# Patient Record
Sex: Female | Born: 1985 | Race: Black or African American | Hispanic: No | Marital: Married | Smoking: Never smoker
Health system: Southern US, Community
[De-identification: ages and names within clinical notes are randomized; demographics above are authoritative.]

## PROBLEM LIST (undated history)

## (undated) DIAGNOSIS — E669 Obesity, unspecified: Secondary | ICD-10-CM

## (undated) DIAGNOSIS — J189 Pneumonia, unspecified organism: Secondary | ICD-10-CM

## (undated) DIAGNOSIS — O99519 Diseases of the respiratory system complicating pregnancy, unspecified trimester: Secondary | ICD-10-CM

---

## 2011-08-30 DIAGNOSIS — Z8701 Personal history of pneumonia (recurrent): Secondary | ICD-10-CM | POA: Insufficient documentation

## 2016-08-28 ENCOUNTER — Encounter: Payer: Self-pay | Admitting: Emergency Medicine

## 2016-08-28 ENCOUNTER — Emergency Department
Admission: EM | Admit: 2016-08-28 | Discharge: 2016-08-28 | Disposition: A | Discharge status: Home / Self Care | Payer: Medicaid Other | Attending: Emergency Medicine | Admitting: Emergency Medicine

## 2016-08-28 ENCOUNTER — Emergency Department: Payer: Medicaid Other

## 2016-08-28 DIAGNOSIS — R1030 Lower abdominal pain, unspecified: Secondary | ICD-10-CM | POA: Diagnosis present

## 2016-08-28 DIAGNOSIS — R58 Hemorrhage, not elsewhere classified: Secondary | ICD-10-CM

## 2016-08-28 DIAGNOSIS — O200 Threatened abortion: Secondary | ICD-10-CM | POA: Diagnosis not present

## 2016-08-28 DIAGNOSIS — O208 Other hemorrhage in early pregnancy: Secondary | ICD-10-CM | POA: Insufficient documentation

## 2016-08-28 DIAGNOSIS — Z3A10 10 weeks gestation of pregnancy: Secondary | ICD-10-CM | POA: Insufficient documentation

## 2016-08-28 LAB — CBC
HEMATOCRIT: 35.8 % (ref 35.0–47.0)
HEMOGLOBIN: 12.5 g/dL (ref 12.0–16.0)
MCH: 32.7 pg (ref 26.0–34.0)
MCHC: 35.1 g/dL (ref 32.0–36.0)
MCV: 93.2 fL (ref 80.0–100.0)
PLATELETS: 193 10*3/uL (ref 150–440)
RBC: 3.84 MIL/uL (ref 3.80–5.20)
RDW: 13.4 % (ref 11.5–14.5)
WBC: 6.2 10*3/uL (ref 3.6–11.0)

## 2016-08-28 LAB — POCT PREGNANCY, URINE: Preg Test, Ur: POSITIVE — AB

## 2016-08-28 LAB — HCG, QUANTITATIVE, PREGNANCY: hCG, Beta Chain, Quant, S: 28722 m[IU]/mL — ABNORMAL HIGH (ref <5)

## 2016-08-28 LAB — ABO/RH: ABO/RH(D): A POS

## 2016-08-28 NOTE — ED Provider Notes (Signed)
[facility] Emergency Department Provider Note   ____________________________________________   First MD Initiated Contact with Patient 08/28/16 [phone]     (approximate)  I have reviewed the triage vital signs and the nursing notes.   HISTORY  Chief Complaint Vaginal Bleeding and Abdominal Cramping    HPI Christina Bautista is a 31 y.o. female reports for about the last 3-4 days that she had been having occasional lower abdominal cramping with some slight vaginal spotting. Denies any vaginal bleeding. Reports she may be about [redacted] weeks pregnant, she had a 1 period in May that lasted about 6-7 days around May 24. During mid May she had to take a plan B after intercourse.  Denies any nausea or vomiting. No fevers. No headache. Denies any severe pain or heavy vaginal bleeding. Reports spotting slightly bloody only a pad every few hours.No soaked pads or heavy bleeding.  No pain or burning with urination. No unusual odor or discharge   History reviewed. No pertinent past medical history.  There are no active problems to display for this patient.   History reviewed. No pertinent surgical history.  Prior to Admission medications   Not on File  3 previous pregnancies, one miscarriage. No fertility treatments. No history of tubal or ectopic  Allergies Patient has no known allergies.  No family history on file.  Social History Social History  Substance Use Topics  . Smoking status: Never Smoker  . Smokeless tobacco: Never Used  . Alcohol use No    Review of Systems Constitutional: No fever/chills Eyes: No visual changes. ENT: No sore throat. Cardiovascular: Denies chest pain. Respiratory: Denies shortness of breath. Gastrointestinal: No abdominal pain.  No nausea, no vomiting.  No diarrhea.  No constipation. Genitourinary: Negative for dysuria.See history of present illness Musculoskeletal: Negative for back pain. Skin: Negative for  rash. Neurological: Negative for headaches, focal weakness or numbness.    ____________________________________________   PHYSICAL EXAM:  VITAL SIGNS: ED Triage Vitals  Enc Vitals Group     BP 08/28/16 0942 (!) 141/84     Pulse Rate 08/28/16 0942 78     Resp 08/28/16 0942 16     Temp 08/28/16 0942 99.4 F (37.4 C)     Temp Source 08/28/16 0942 Oral     SpO2 08/28/16 0942 99 %     Weight 08/28/16 0942 230 lb (104.3 kg)     Height 08/28/16 0942 5\' 9"  (1.753 m)     Head Circumference --      Peak Flow --      Pain Score 08/28/16 0941 6     Pain Loc --      Pain Edu? --      Excl. in GC? --     Constitutional: Alert and oriented. Well appearing and in no acute distress. Eyes: Conjunctivae are normal. Head: Atraumatic. Nose: No congestion/rhinnorhea. Mouth/Throat: Mucous membranes are moist. Neck: No stridor.   Cardiovascular: Normal rate, regular rhythm. Grossly normal heart sounds.  Good peripheral circulation. Respiratory: Normal respiratory effort.  No retractions. Lungs CTAB. Gastrointestinal: Soft and nontender except for minimal discomfort suprapubically. No distention. No obviously gravid abdomen Musculoskeletal: No lower extremity tenderness nor edema. Neurologic:  Normal speech and language. No gross focal neurologic deficits are appreciated.  Skin:  Skin is warm, dry and intact. No rash noted. Psychiatric: Mood and affect are normal. Speech and behavior are normal.  ____________________________________________   LABS (all labs ordered are listed, but only abnormal results are displayed)  Labs Reviewed  HCG, QUANTITATIVE, PREGNANCY - Abnormal; Notable for the following:       Result Value   hCG, Beta Chain, [place] Longest 28,722 (*)    All other components within normal limits  POCT PREGNANCY, URINE - Abnormal; Notable for the following:    Preg Test, Ur POSITIVE (*)    All other components within normal limits  CBC  ABO/RH    ____________________________________________  EKG   ____________________________________________  RADIOLOGY  US Ob Comp < 14 Wks  Result Date: 08/28/2016 CLINICAL DATA:  Positive pregnancy test, vaginal bleeding, pelvic cramping EXAM: OBSTETRIC <14 WK [place] AND TRANSVAGINAL OB US TECHNIQUE: Both transabdominal and transvaginal ultrasound examinations were performed for complete evaluation of the gestation as well as the maternal uterus, adnexal regions, and pelvic cul-de-sac. Transvaginal technique was performed to assess early pregnancy. COMPARISON:  None. FINDINGS: Intrauterine gestational sac: Single Yolk sac:  Not Visualized. Embryo:  Visualized. Cardiac Activity: Not Visualized. Heart Rate: Not detected CRL:  2.7 cm   9 w   3 d Subchorionic hemorrhage:  None visualized. Maternal uterus/adnexae: Irregularly shaped gestational sac. Fetal pole visualized but no detectable cardiac activity, motion, or heart rate compatible with nonviable pregnancy. Ovaries appear normal.  No pelvic free fluid. IMPRESSION: Single IUP with a crown-rump length of 2.7 cm but no visualized cardiac activity or detected heart rate. Findings meet definitive criteria for failed pregnancy. This follows SRU consensus guidelines: Diagnostic Criteria for Nonviable Pregnancy Early in the First Trimester. Macy Mis J Med [phone]. Electronically Signed   By: Judie Petit.  Shick M.D.   On: 08/28/2016 11:44   US Ob Transvaginal  Result Date: 08/28/2016 CLINICAL DATA:  Positive pregnancy test, vaginal bleeding, pelvic cramping EXAM: OBSTETRIC <14 WK [place] AND TRANSVAGINAL OB US TECHNIQUE: Both transabdominal and transvaginal ultrasound examinations were performed for complete evaluation of the gestation as well as the maternal uterus, adnexal regions, and pelvic cul-de-sac. Transvaginal technique was performed to assess early pregnancy. COMPARISON:  None. FINDINGS: Intrauterine gestational sac: Single Yolk sac:  Not Visualized. Embryo:   Visualized. Cardiac Activity: Not Visualized. Heart Rate: Not detected CRL:  2.7 cm   9 w   3 d Subchorionic hemorrhage:  None visualized. Maternal uterus/adnexae: Irregularly shaped gestational sac. Fetal pole visualized but no detectable cardiac activity, motion, or heart rate compatible with nonviable pregnancy. Ovaries appear normal.  No pelvic free fluid. IMPRESSION: Single IUP with a crown-rump length of 2.7 cm but no visualized cardiac activity or detected heart rate. Findings meet definitive criteria for failed pregnancy. This follows SRU consensus guidelines: Diagnostic Criteria for Nonviable Pregnancy Early in the First Trimester. Macy Mis J Med [phone]. Electronically Signed   By: Judie Petit.  Shick M.D.   On: 08/28/2016 11:44    ____________________________________________   PROCEDURES  Procedure(s) performed: None  Procedures  Critical Care performed: No  ____________________________________________   INITIAL IMPRESSION / ASSESSMENT AND PLAN / ED COURSE  Pertinent labs & imaging results that were available during my care of the patient were reviewed by me and considered in my medical decision making (see chart for details).    Suspected pregnancy. Now presenting with cramping and light vaginal spotting. Hemodynamically stable. No infectious symptoms. Low risk pretest for ectopic. We'll obtain ultrasound to further evaluate. No acute abdomen. Reassuring examination. RH +  ----------------------------------------- 12:05 PM on 08/28/2016 -----------------------------------------  Case and ultrasound discussed with Dr. Valentino Saxon of cardiology. Patient will follow-up closely outpatient. Reports no pain or discomfort at present, no significant bleeding. Awake and  alert, agreeable with plan for discharge and careful return precautions. Return precautions and treatment recommendations and follow-up discussed with the patient who is agreeable with the plan.        ____________________________________________   FINAL CLINICAL IMPRESSION(S) / ED DIAGNOSES  Final diagnoses:  Bleeding  Threatened miscarriage in early pregnancy      NEW MEDICATIONS STARTED DURING THIS VISIT:  New Prescriptions   No medications on file     Note:  This document was prepared using Dragon voice recognition software and may include unintentional dictation errors.     Sharyn CreamerQuale, Clydia Nieves, MD 08/28/16 [phone]

## 2016-08-28 NOTE — Discharge Instructions (Signed)
Please follow up closely with obstetrics and gynecology or your primary doctor.  Return to the emergency room if your bleeding worsens, you become weak and dizzy or lightheaded, you have an episode of passing out, develop severe bleeding such as more than 1 soaked pad per hour for more than 3 straight hours, develop abdominal or pelvic pain, fevers chills or other new concerns arise.   

## 2016-08-28 NOTE — ED Triage Notes (Addendum)
Pt to ED via POV c/o right lower abdominal cramping and vaginal bleeding. Pt states that cramping and bleeding started on Wednesday. Pt has had positive pregnancy test at home but has not had pregnancy confirmed by OB. Pt states that she is approximately [redacted] weeks pregnant.  Pt denies passing any blood clots. Pt states that bleeding is more like a spotting. Pt states that she is just using pantie liner and not having to use pads. Pt does not appear to be in any distress at this time.  G4 P3

## 2016-08-31 ENCOUNTER — Emergency Department: Payer: Medicaid Other | Admitting: Anesthesiology

## 2016-08-31 ENCOUNTER — Encounter
Admission: EM | Disposition: A | Discharge status: Home / Self Care | Payer: Self-pay | Source: Home / Self Care | Attending: Emergency Medicine

## 2016-08-31 ENCOUNTER — Emergency Department
Admission: EM | Admit: 2016-08-31 | Discharge: 2016-08-31 | Disposition: A | Discharge status: Home / Self Care | Payer: Medicaid Other | Attending: Emergency Medicine | Admitting: Emergency Medicine

## 2016-08-31 DIAGNOSIS — O034 Incomplete spontaneous abortion without complication: Secondary | ICD-10-CM | POA: Insufficient documentation

## 2016-08-31 DIAGNOSIS — O039 Complete or unspecified spontaneous abortion without complication: Secondary | ICD-10-CM | POA: Diagnosis not present

## 2016-08-31 DIAGNOSIS — Z9889 Other specified postprocedural states: Secondary | ICD-10-CM

## 2016-08-31 DIAGNOSIS — Z3A09 9 weeks gestation of pregnancy: Secondary | ICD-10-CM | POA: Diagnosis not present

## 2016-08-31 HISTORY — PX: DILATION AND EVACUATION: SHX1459

## 2016-08-31 LAB — CBC
HEMATOCRIT: 31.7 % — AB (ref 35.0–47.0)
HEMOGLOBIN: 10.7 g/dL — AB (ref 12.0–16.0)
MCH: 31.8 pg (ref 26.0–34.0)
MCHC: 33.8 g/dL (ref 32.0–36.0)
MCV: 93.9 fL (ref 80.0–100.0)
Platelets: 219 10*3/uL (ref 150–440)
RBC: 3.37 MIL/uL — ABNORMAL LOW (ref 3.80–5.20)
RDW: 13.2 % (ref 11.5–14.5)
WBC: 12 10*3/uL — ABNORMAL HIGH (ref 3.6–11.0)

## 2016-08-31 LAB — HCG, QUANTITATIVE, PREGNANCY: hCG, Beta Chain, Quant, S: 10733 m[IU]/mL — ABNORMAL HIGH (ref <5)

## 2016-08-31 LAB — BASIC METABOLIC PANEL
Anion gap: 6 (ref 5–15)
BUN: 6 mg/dL (ref 6–20)
CHLORIDE: 109 mmol/L (ref 101–111)
CO2: 21 mmol/L — AB (ref 22–32)
CREATININE: 0.61 mg/dL (ref 0.44–1.00)
Calcium: 8.1 mg/dL — ABNORMAL LOW (ref 8.9–10.3)
GFR calc Af Amer: >60 mL/min (ref >60)
GFR calc non Af Amer: >60 mL/min (ref >60)
Glucose, Bld: 110 mg/dL — ABNORMAL HIGH (ref 65–99)
Potassium: 3.6 mmol/L (ref 3.5–5.1)
Sodium: 136 mmol/L (ref 135–145)

## 2016-08-31 LAB — TYPE AND SCREEN
ABO/RH(D): A POS
Antibody Screen: NEGATIVE

## 2016-08-31 SURGERY — DILATION AND EVACUATION, UTERUS
Anesthesia: General | Site: Vagina | Wound class: Clean Contaminated

## 2016-08-31 MED ORDER — FENTANYL CITRATE (PF) 100 MCG/2ML IJ SOLN: 25.0000 ug | INTRAMUSCULAR | Status: DC | PRN

## 2016-08-31 MED ORDER — PROPOFOL 10 MG/ML IV BOLUS
INTRAVENOUS | Status: AC
  Filled 2016-08-31: qty 20

## 2016-08-31 MED ORDER — IBUPROFEN 600 MG PO TABS: 600.0000 mg | ORAL_TABLET | Freq: Four times a day (QID) | ORAL | 0 refills | Status: DC | PRN

## 2016-08-31 MED ORDER — FENTANYL CITRATE (PF) 100 MCG/2ML IJ SOLN
INTRAMUSCULAR | Status: AC
  Filled 2016-08-31: qty 2

## 2016-08-31 MED ORDER — METRONIDAZOLE 500 MG PO TABS: 500.0000 mg | ORAL_TABLET | Freq: Two times a day (BID) | ORAL | 0 refills | Status: AC

## 2016-08-31 MED ORDER — GLYCOPYRROLATE 0.2 MG/ML IJ SOLN
INTRAMUSCULAR | Status: AC
  Filled 2016-08-31: qty 1

## 2016-08-31 MED ORDER — DEXAMETHASONE SODIUM PHOSPHATE 10 MG/ML IJ SOLN
INTRAMUSCULAR | Status: DC | PRN
  Administered 2016-08-31: 10 mg via INTRAVENOUS

## 2016-08-31 MED ORDER — MIDAZOLAM HCL 2 MG/2ML IJ SOLN
INTRAMUSCULAR | Status: DC | PRN
  Administered 2016-08-31: 2 mg via INTRAVENOUS

## 2016-08-31 MED ORDER — PROPOFOL 10 MG/ML IV BOLUS
INTRAVENOUS | Status: DC | PRN
  Administered 2016-08-31: 150 mg via INTRAVENOUS

## 2016-08-31 MED ORDER — SODIUM CHLORIDE 0.9 % IV BOLUS (SEPSIS)
1000.0000 mL | Freq: Once | INTRAVENOUS | Status: AC
  Administered 2016-08-31: 1000 mL via INTRAVENOUS

## 2016-08-31 MED ORDER — PHENYLEPHRINE HCL 10 MG/ML IJ SOLN
INTRAMUSCULAR | Status: DC | PRN
  Administered 2016-08-31 (×5): 200 ug via INTRAVENOUS

## 2016-08-31 MED ORDER — ONDANSETRON HCL 4 MG/2ML IJ SOLN
4.0000 mg | Freq: Once | INTRAMUSCULAR | Status: AC
  Administered 2016-08-31: 4 mg via INTRAVENOUS
  Filled 2016-08-31: qty 2

## 2016-08-31 MED ORDER — ONDANSETRON HCL 4 MG/2ML IJ SOLN
INTRAMUSCULAR | Status: DC | PRN
  Administered 2016-08-31: 4 mg via INTRAVENOUS

## 2016-08-31 MED ORDER — FERROUS SULFATE 325 (65 FE) MG PO TABS: 325.0000 mg | ORAL_TABLET | Freq: Two times a day (BID) | ORAL | 1 refills | Status: DC

## 2016-08-31 MED ORDER — DOXYCYCLINE HYCLATE 100 MG PO TABS
200.0000 mg | ORAL_TABLET | Freq: Once | ORAL | Status: AC
  Administered 2016-08-31: 200 mg via ORAL
  Filled 2016-08-31: qty 2

## 2016-08-31 MED ORDER — DEXAMETHASONE SODIUM PHOSPHATE 10 MG/ML IJ SOLN
INTRAMUSCULAR | Status: AC
  Filled 2016-08-31: qty 1

## 2016-08-31 MED ORDER — FENTANYL CITRATE (PF) 100 MCG/2ML IJ SOLN
INTRAMUSCULAR | Status: DC | PRN
Start: 2016-08-31 — End: 2016-08-31
  Administered 2016-08-31 (×2): 25 ug via INTRAVENOUS

## 2016-08-31 MED ORDER — LIDOCAINE HCL (CARDIAC) 20 MG/ML IV SOLN
INTRAVENOUS | Status: DC | PRN
  Administered 2016-08-31: 100 mg via INTRAVENOUS

## 2016-08-31 MED ORDER — OXYCODONE-ACETAMINOPHEN 5-325 MG PO TABS: 1.0000 | ORAL_TABLET | Freq: Four times a day (QID) | ORAL | 0 refills | Status: DC | PRN

## 2016-08-31 MED ORDER — MIDAZOLAM HCL 2 MG/2ML IJ SOLN
INTRAMUSCULAR | Status: AC
  Filled 2016-08-31: qty 2

## 2016-08-31 MED ORDER — ONDANSETRON HCL 4 MG/2ML IJ SOLN
INTRAMUSCULAR | Status: AC
Start: 2016-08-31 — End: ?
  Filled 2016-08-31: qty 2

## 2016-08-31 MED ORDER — ONDANSETRON HCL 4 MG/2ML IJ SOLN: 4.0000 mg | Freq: Once | INTRAMUSCULAR | Status: DC | PRN

## 2016-08-31 MED ORDER — FENTANYL CITRATE (PF) 100 MCG/2ML IJ SOLN
50.0000 ug | Freq: Once | INTRAMUSCULAR | Status: AC
Start: 2016-08-31 — End: 2016-08-31
  Administered 2016-08-31: 50 ug via INTRAVENOUS
  Filled 2016-08-31: qty 2

## 2016-08-31 MED ORDER — LACTATED RINGERS IV SOLN
INTRAVENOUS | Status: DC | PRN
  Administered 2016-08-31: 15:00:00 via INTRAVENOUS

## 2016-08-31 MED ORDER — GLYCOPYRROLATE 0.2 MG/ML IJ SOLN
INTRAMUSCULAR | Status: DC | PRN
  Administered 2016-08-31: 0.2 mg via INTRAVENOUS

## 2016-08-31 MED ORDER — DOXYCYCLINE HYCLATE 100 MG IV SOLR
100.0000 mg | INTRAVENOUS | Status: AC
  Administered 2016-08-31: 100 mg via INTRAVENOUS
  Filled 2016-08-31: qty 100

## 2016-08-31 SURGICAL SUPPLY — 20 items
CATH ROBINSON RED A/P 16FR (CATHETERS) ×3 IMPLANT
COVER LIGHT HANDLE STERIS (MISCELLANEOUS) ×6 IMPLANT
FILTER UTR ASPR SPEC (MISCELLANEOUS) ×1 IMPLANT
FLTR UTR ASPR SPEC (MISCELLANEOUS) ×3
GLOVE BIO SURGEON STRL SZ7 (GLOVE) ×3 IMPLANT
GOWN STRL REUS W/ TWL LRG LVL3 (GOWN DISPOSABLE) ×2 IMPLANT
GOWN STRL REUS W/TWL LRG LVL3 (GOWN DISPOSABLE) ×4
KIT BERKELEY 1ST TRIMESTER 3/8 (MISCELLANEOUS) ×3 IMPLANT
KIT RM TURNOVER CYSTO AR (KITS) ×3 IMPLANT
NS IRRIG 500ML POUR BTL (IV SOLUTION) ×3 IMPLANT
PACK DNC HYST (MISCELLANEOUS) ×3 IMPLANT
PAD OB MATERNITY 4.3X12.25 (PERSONAL CARE ITEMS) ×3 IMPLANT
PAD PREP 24X41 OB/GYN DISP (PERSONAL CARE ITEMS) ×3 IMPLANT
SET BERKELEY SUCTION TUBING (SUCTIONS) ×3 IMPLANT
SPONGE XRAY 4X4 16PLY STRL (MISCELLANEOUS) ×6 IMPLANT
TOWEL OR 17X26 4PK STRL BLUE (TOWEL DISPOSABLE) ×3 IMPLANT
VACURETTE 10 RIGID CVD (CANNULA) IMPLANT
VACURETTE 12 RIGID CVD (CANNULA) IMPLANT
VACURETTE 8 RIGID CVD (CANNULA) IMPLANT
VACURETTE 8MM F TIP (MISCELLANEOUS) ×3 IMPLANT

## 2016-08-31 NOTE — Discharge Instructions (Signed)

## 2016-08-31 NOTE — Anesthesia Post-op Follow-up Note (Cosign Needed)
Anesthesia QCDR form completed.        

## 2016-08-31 NOTE — Transfer of Care (Signed)
Immediate Anesthesia Transfer of Care Note  Patient: Christina Bautista  Procedure(s) Performed: Procedure(s): DILATATION AND EVACUATION (N/A)  Patient Location: PACU  Anesthesia Type:General  Level of Consciousness: sedated  Airway & Oxygen Therapy: Patient Spontanous Breathing and Patient connected to face mask oxygen  Post-op Assessment: Report given to RN and Post -op Vital signs reviewed and stable  Post vital signs: Reviewed and stable  Last Vitals:  Vitals:   08/31/16 1444 08/31/16 1606  BP: 104/60 (!) 109/59  Pulse: 82 74  Resp: 18 (!) 25  Temp:  (!) 36.2 C    Complications: No apparent anesthesia complications

## 2016-08-31 NOTE — ED Notes (Signed)
Patient left to OR

## 2016-08-31 NOTE — Anesthesia Postprocedure Evaluation (Signed)
Anesthesia Post Note  Patient: Christina Bautista  Procedure(s) Performed: Procedure(s) (LRB): DILATATION AND EVACUATION (N/A)  Patient location during evaluation: PACU Anesthesia Type: General Level of consciousness: awake and alert Pain management: pain level controlled Vital Signs Assessment: post-procedure vital signs reviewed and stable Respiratory status: spontaneous breathing, nonlabored ventilation, respiratory function stable and patient connected to nasal cannula oxygen Cardiovascular status: blood pressure returned to baseline and stable Postop Assessment: no signs of nausea or vomiting Anesthetic complications: no     Last Vitals:  Vitals:   08/31/16 1659 08/31/16 1737  BP: (!) 109/52 102/61  Pulse: 77 86  Resp: 16 16  Temp: 37.1 C     Last Pain:  Vitals:   08/31/16 1659  TempSrc: Oral  PainSc:                  Arless Vineyard S     

## 2016-08-31 NOTE — Anesthesia Postprocedure Evaluation (Signed)
Anesthesia Post Note  Patient: Christina Bautista  Procedure(s) Performed: Procedure(s) (LRB): DILATATION AND EVACUATION (N/A)  Patient location during evaluation: PACU Anesthesia Type: General Level of consciousness: awake and alert Pain management: pain level controlled Vital Signs Assessment: post-procedure vital signs reviewed and stable Respiratory status: spontaneous breathing, nonlabored ventilation, respiratory function stable and patient connected to nasal cannula oxygen Cardiovascular status: blood pressure returned to baseline and stable Postop Assessment: no signs of nausea or vomiting Anesthetic complications: no     Last Vitals:  Vitals:   08/31/16 1659 08/31/16 1737  BP: (!) 109/52 102/61  Pulse: 77 86  Resp: 16 16  Temp: 37.1 C     Last Pain:  Vitals:   08/31/16 1659  TempSrc: Oral  PainSc:                  Meiah Zamudio S

## 2016-08-31 NOTE — ED Notes (Signed)
Dr. Bonney AidStaebler at bedside.

## 2016-08-31 NOTE — H&P (Signed)
Obstetrics & Gynecology H&P    Chief Complaint:  Vaginal bleeding, cramping   History of Present Illness: Patient is a 31 y.o. [id number] with a previously diagnosed 9 week missed abortion presenting with increasing cramping and bleeding.  Per the patient bleeding picked up this morning along with cramping.  Has passed large clots.  Had a near syncopal event at home followed by another in the emergency department. Still feeling slightly dizzy. Patient has had one prior uncomplicated SAB.  She had no other risk factors for miscarriage. Bleeding and cramping improved per patient following removal of some products at the os, she did however receive 11mg of fentanyl just prior to my arrival  Review of Systems:10 point review of systems  Past Medical History:  History reviewed. No pertinent past medical history.  Past Surgical History:  History reviewed. No pertinent surgical history.  Gynecologic History:   Obstetric History: G1P0  Family History:  History reviewed. No pertinent family history.  Social History:  Social History   Social History  . Marital status: Married    Spouse name: N/A  . Number of children: N/A  . Years of education: N/A   Occupational History  . Not on file.   Social History Main Topics  . Smoking status: Never Smoker  . Smokeless tobacco: Never Used  . Alcohol use No  . Drug use: No  . Sexual activity: Not on file   Other Topics Concern  . Not on file   Social History Narrative  . No narrative on file    Allergies:  No Known Allergies  Medications: Prior to Admission medications   Medication Sig Start Date End Date Taking? Authorizing Provider  acetaminophen (TYLENOL) 500 MG tablet Take 500 mg by mouth 3 (three) times daily.   Yes [provider]  ibuprofen (ADVIL,MOTRIN) 200 MG tablet Take 200 mg by mouth 4 (four) times daily.   Yes [provider]    Physical Exam Vitals: Blood pressure (!) 100/57, pulse 72,  temperature 98.3 F (36.8 C), temperature source Oral, resp. rate 18, height 5' 9"  (1.753 m), weight 230 lb (104.3 kg), SpO2 99 %. General: NAD HEENT: normocephalic, anicteric Pulmonary: No increased work of breathing Cardiovascular: RRR, distal pulses 2+ Abdomen: obese, soft, non-tender Genitourinary: Large amount of blood on chuck pad and perineum, additional 5064mof clots removed from vaginal vault.  The cervix is dilated approximately 1cm, I was able to removed some tissue from the os that appeared fetal but there was not placental tissue that I could identify in the clots or at the os.  Bleeding went from brisk to moderate after removal. Extremities: no edema, erythema, or tenderness Neurologic: Grossly intact Psychiatric: mood appropriate, affect full  Labs: Results for orders placed or performed during the hospital encounter of 08/31/16 (from the past 72 hour(s))  Type and screen ALBonnie   Status: None   Collection Time: 08/31/16  1:17 PM  Result Value Ref Range   ABO/RH(D) A POS    Antibody Screen NEG    Sample Expiration 09/03/2016   CBC     Status: Abnormal   Collection Time: 08/31/16  1:17 PM  Result Value Ref Range   WBC 12.0 (H) 3.6 - 11.0 K/uL   RBC 3.37 (L) 3.80 - 5.20 MIL/uL   Hemoglobin 10.7 (L) 12.0 - 16.0 g/dL   HCT 31.7 (L) 35.0 - 47.0 %   MCV 93.9 80.0 - 100.0 fL   MCH 31.8 26.0 -  34.0 pg   MCHC 33.8 32.0 - 36.0 g/dL   RDW 13.2 11.5 - 14.5 %   Platelets 219 150 - 440 K/uL  Basic metabolic panel     Status: Abnormal   Collection Time: 08/31/16  1:17 PM  Result Value Ref Range   Sodium 136 135 - 145 mmol/L   Potassium 3.6 3.5 - 5.1 mmol/L   Chloride 109 101 - 111 mmol/L   CO2 21 (L) 22 - 32 mmol/L   Glucose, Bld 110 (H) 65 - 99 mg/dL   BUN 6 6 - 20 mg/dL   Creatinine, Ser 0.61 0.44 - 1.00 mg/dL   Calcium 8.1 (L) 8.9 - 10.3 mg/dL   GFR calc non Af Amer >60 >60 mL/min   GFR calc Af Amer >60 >60 mL/min    Comment: (NOTE) The  eGFR has been calculated using the CKD EPI equation. This calculation has not been validated in all clinical situations. eGFR's persistently <60 mL/min signify possible Chronic Kidney Disease.    Anion gap 6 5 - 15    Imaging US Ob Comp < 14 Wks  Result Date: 08/28/2016 CLINICAL DATA:  Positive pregnancy test, vaginal bleeding, pelvic cramping EXAM: OBSTETRIC <14 WK [place] AND TRANSVAGINAL OB US TECHNIQUE: Both transabdominal and transvaginal ultrasound examinations were performed for complete evaluation of the gestation as well as the maternal uterus, adnexal regions, and pelvic cul-de-sac. Transvaginal technique was performed to assess early pregnancy. COMPARISON:  None. FINDINGS: Intrauterine gestational sac: Single Yolk sac:  Not Visualized. Embryo:  Visualized. Cardiac Activity: Not Visualized. Heart Rate: Not detected CRL:  2.7 cm   9 w   3 d Subchorionic hemorrhage:  None visualized. Maternal uterus/adnexae: Irregularly shaped gestational sac. Fetal pole visualized but no detectable cardiac activity, motion, or heart rate compatible with nonviable pregnancy. Ovaries appear normal.  No pelvic free fluid. IMPRESSION: Single IUP with a crown-rump length of 2.7 cm but no visualized cardiac activity or detected heart rate. Findings meet definitive criteria for failed pregnancy. This follows SRU consensus guidelines: Diagnostic Criteria for Nonviable Pregnancy Early in the First Trimester. Alison Stalling J Med [phone]. Electronically Signed   By: Jerilynn Mages.  Shick M.D.   On: 08/28/2016 11:44   US Ob Transvaginal  Result Date: 08/28/2016 CLINICAL DATA:  Positive pregnancy test, vaginal bleeding, pelvic cramping EXAM: OBSTETRIC <14 WK [place] AND TRANSVAGINAL OB US TECHNIQUE: Both transabdominal and transvaginal ultrasound examinations were performed for complete evaluation of the gestation as well as the maternal uterus, adnexal regions, and pelvic cul-de-sac. Transvaginal technique was performed to assess early  pregnancy. COMPARISON:  None. FINDINGS: Intrauterine gestational sac: Single Yolk sac:  Not Visualized. Embryo:  Visualized. Cardiac Activity: Not Visualized. Heart Rate: Not detected CRL:  2.7 cm   9 w   3 d Subchorionic hemorrhage:  None visualized. Maternal uterus/adnexae: Irregularly shaped gestational sac. Fetal pole visualized but no detectable cardiac activity, motion, or heart rate compatible with nonviable pregnancy. Ovaries appear normal.  No pelvic free fluid. IMPRESSION: Single IUP with a crown-rump length of 2.7 cm but no visualized cardiac activity or detected heart rate. Findings meet definitive criteria for failed pregnancy. This follows SRU consensus guidelines: Diagnostic Criteria for Nonviable Pregnancy Early in the First Trimester. Alison Stalling J Med [phone]. Electronically Signed   By: Jerilynn Mages.  Shick M.D.   On: 08/28/2016 11:44    Assessment: 31 y.o. [id number] with incomplete abortion at [redacted] weeks gestation  Plan: - Given amount of bleeding, symptomatic, and acute drop in H&H  will proceed with emergent D&C to verify uterus completely evacuate. - A positive per prior visit rhogam not indicated - Preoperative antibiotics doxycyline 172m IV - SCD's to OR - Tissue removed at bedside to go to OR with patient will send as one specimen.

## 2016-08-31 NOTE — ED Provider Notes (Signed)
[facility] Emergency Department Provider Note  Time seen: 12:52 PM  I have reviewed the triage vital signs and the nursing notes.   HISTORY  Chief Complaint Miscarriage    HPI Christina Bautista is a 31 y.o. female approximately [redacted] weeks pregnant who presents to the emergency department for vaginal bleeding and possible miscarriage. According to the patient she was seen in the emergency department for abdominal cramping 3 days ago was diagnosed with a 9 week 3 day fetus with no cardiac activity most consistent with miscarriage. States she has been having intermittent cramping over the past 3 days and approximately 1 hour prior to arrival developed vaginal bleeding with clot. Patient states weakness and lightheadedness, had a syncopal episode in the ambulance. No history of syncope in the past. Upon arrival patient is in mild distress/discomfort she describes moderate lower abdominal cramping every 5-6 minutes.  History reviewed. No pertinent past medical history.  There are no active problems to display for this patient.   History reviewed. No pertinent surgical history.  Prior to Admission medications   Not on File    No Known Allergies  History reviewed. No pertinent family history.  Social History Social History  Substance Use Topics  . Smoking status: Never Smoker  . Smokeless tobacco: Never Used  . Alcohol use No    Review of Systems Constitutional: Negative for fever Cardiovascular: Negative for chest pain. Respiratory: Negative for shortness of breath. Gastrointestinal: Lower abdominal cramping. Negative for nausea vomiting or diarrhea. Genitourinary: Negative for dysuria. Positive for vaginal bleeding with clot Musculoskeletal: Negative for back pain. Skin: Negative for rash. Neurological: Negative for headache All other ROS negative  ____________________________________________   PHYSICAL EXAM:  VITAL SIGNS: ED Triage Vitals  Enc  Vitals Group     BP 08/31/16 1234 103/65     Pulse Rate 08/31/16 1234 79     Resp 08/31/16 1234 20     Temp 08/31/16 1234 98.3 F (36.8 C)     Temp Source 08/31/16 1234 Oral     SpO2 08/31/16 1234 98 %     Weight 08/31/16 1237 230 lb (104.3 kg)     Height 08/31/16 1237 5\' 9"  (1.753 m)     Head Circumference --      Peak Flow --      Pain Score 08/31/16 1234 7     Pain Loc --      Pain Edu? --      Excl. in GC? --     Constitutional: Alert and oriented. Mild distress due to abdominal discomfort at times. Eyes: Normal exam ENT   Head: Normocephalic and atraumatic   Mouth/Throat: Mucous membranes are moist. Cardiovascular: Normal rate, regular rhythm. No murmur Respiratory: Normal respiratory effort without tachypnea nor retractions. Breath sounds are clear  Gastrointestinal: Soft, mild to moderate suprapubic tenderness palpation. No rebound or guarding. No distention. Abdomen otherwise benign. Musculoskeletal: Nontender with normal range of motion in all extremities.  Neurologic:  Normal speech and language. No gross focal neurologic deficits  Skin:  Skin is warm, dry and intact.  Psychiatric: Mood and affect are normal.  ____________________________________________   INITIAL IMPRESSION / ASSESSMENT AND PLAN / ED COURSE  Pertinent labs & imaging results that were available during my care of the patient were reviewed by me and considered in my medical decision making (see chart for details).  Patient presents to the emergency department with a likely active miscarriage. Ultrasound reviewed from 3 days ago shows 9 week  3 day fetus without cardiac activity. We will check labs, perform a pelvic exam, IV hydrate treat pain and continue to closely monitor.  Patient had a near syncopal event, blood pressure found to be in the 60s patient placed in Trendelenburg position, blood pressure increased to 100 systolic. Patient states the pain is worsening like contractions will come  and go every 4-5 minutes. Patient continues to have moderate bleeding in the emergency department. I have placed an 18-gauge IV with ultrasound guidance. We will bolus 2 L of IV fluids. I discussed the patient with Dr. Chauncey CruelStabler of OB/GYN who will be down to see the patient to discuss further management.  Dr. Chauncey CruelStabler will be taking to the OR for D&C. Patient's beta hCG is decreasing consistent with active miscarriage.  ____________________________________________   FINAL CLINICAL IMPRESSION(S) / ED DIAGNOSES  Miscarriage    Minna AntisPaduchowski, Damarion Mendizabal, MD 08/31/16 1450

## 2016-08-31 NOTE — ED Notes (Signed)
Doxycycline with patient on transfer to the OR.

## 2016-08-31 NOTE — Op Note (Addendum)
Patient Name: Christina Bautista Date of Procedure: 08/31/16  Preoperative Diagnosis: 1) 31 y.o. with incomplete 9 week spontaneous abortion  Postoperative Diagnosis: 1) 31 y.o. with incomplete 9 week spontaneous abortion  Operation Performed: Suction dilation and curettage  Indication: Patient presented to emergency room with vaginal hemorrhage, cramping, and two near syncopal events.  Was previously diagnosed with 9 week missed abortion 3 days prior.  Anesthesia: General  Primary Surgeon: Vena AustriaAndreas Fairley Copher, MD  Assistant: none  Preoperative Antibiotics: none  Estimated Blood Loss: 100mL  IV Fluids: 350mL  Urine Output:: 30mL straight cath  Drains or Tubes: none  Implants: none  Specimens Removed: Products of conception (Some obtained at time of D&C some at time of ED evaluation)  Complications: none  Intraoperative Findings:  1cm dilated cervical os, no further products of conception noted at OS.  Retroverted uterus, parous cervix.  Moderate amount of additional tissue removed.  Hemostatic cervix at conclusion of the case  Patient Condition: stable  Procedure in Detail:  Patient was taken to the operating room were she was administered general endotracheal anesthesia.  She was positioned in the dorsal lithotomy position utilizing Allen stirups, prepped and draped in the usual sterile fashion.  Uterus was noted to be 10 weeks in size, retroverted.   Prior to proceeding with the case a time out was performed.  Attention was turned to the patient's pelvis.  A red rubber catheter was used to empty the patient's bladder.  An operative speculum was placed to allow visualization of the cervix.  The anterior lip of the cervix was grasped with a single tooth tenaculum and the cervix was sequentially dilated using pratt dilators.  A size 8 flexible suction curette was then advanced to the uterine fundus.  Several passes were undertaken to clear the uterine contents.  Sharp curettage was  then  performed nothing good uterine cry throughout the cavity.  A final pass of the suction curette was then undertaken.  The resulting specimen was sent to pathology.    The single tooth tenaculum was removed from the cervix.  The tenaculum sites and cervix were noted to be  Hemostatic before removing the operative speculum.  Sponge needle and instrument counts were corrects times two.  The patient tolerated the procedure well and was taken to the recovery room in stable condition.

## 2016-08-31 NOTE — ED Notes (Signed)
During Dr. Rinaldo RatelStabler's exam, patient had large amount of clots and blood released. Specimen was obtained and put in Formalin and will be sent to the OR with the patient. Patient reports decrease in pain afterwards. Patient was cleaned and new lines and gown were put on.

## 2016-08-31 NOTE — ED Notes (Addendum)
Specimen in Formalin placed in belongings bag with Formalin  Pillow designated as Lab Specimen with patient labels on it. [facility] to [facility] [place]

## 2016-08-31 NOTE — Anesthesia Preprocedure Evaluation (Signed)
Anesthesia Evaluation  Patient identified by MRN, date of birth, ID band Patient awake    Reviewed: Allergy & Precautions, NPO status , Patient's Chart, lab work & pertinent test results, reviewed documented beta blocker date and time   Airway Mallampati: III  TM Distance: >3 FB     Dental  (+) Chipped   Pulmonary           Cardiovascular      Neuro/Psych    GI/Hepatic   Endo/Other    Renal/GU      Musculoskeletal   Abdominal   Peds  Hematology   Anesthesia Other Findings   Reproductive/Obstetrics                             Anesthesia Physical Anesthesia Plan  ASA: III  Anesthesia Plan: General   Post-op Pain Management:    Induction: Intravenous  PONV Risk Score and Plan:   Airway Management Planned: Oral ETT and LMA  Additional Equipment:   Intra-op Plan:   Post-operative Plan:   Informed Consent: I have reviewed the patients History and Physical, chart, labs and discussed the procedure including the risks, benefits and alternatives for the proposed anesthesia with the patient or authorized representative who has indicated his/her understanding and acceptance.     Plan Discussed with: CRNA  Anesthesia Plan Comments:         Anesthesia Quick Evaluation

## 2016-08-31 NOTE — ED Notes (Signed)
Cleaned patient. Changed bed linens and gown.

## 2016-08-31 NOTE — ED Triage Notes (Signed)
Pt presents to ED via [facility] from home for a miscarriage. Pt is actively bleeding, passing large clots. Pt BP dropped in ambulance and pt had syncopal episode. Pt is alert and oriented upon arrival. intermittent cramping q4 minutes. approx 10 weeks preg, no prenatal care established. 5th pregnancy, 3 living children, 1 previous miscarriage.

## 2016-08-31 NOTE — ED Notes (Signed)
Patient placed in trendelenburg position for c/o syncope.

## 2016-08-31 NOTE — Anesthesia Procedure Notes (Signed)
Procedure Name: LMA Insertion Date/Time: 08/31/2016 3:29 PM Performed by: Marlana SalvageJESSUP, Selicia Windom Pre-anesthesia Checklist: Patient identified, Emergency Drugs available, Suction available, Patient being monitored and Timeout performed Patient Re-evaluated:Patient Re-evaluated prior to induction Oxygen Delivery Method: Circle system utilized Preoxygenation: Pre-oxygenation with 100% oxygen Induction Type: IV induction Ventilation: Mask ventilation without difficulty LMA: LMA inserted LMA Size: 4.0 Number of attempts: 1 Placement Confirmation: positive ETCO2 Tube secured with: Tape Dental Injury: Teeth and Oropharynx as per pre-operative assessment

## 2016-09-01 ENCOUNTER — Encounter: Payer: Self-pay | Admitting: Obstetrics and Gynecology

## 2016-09-02 LAB — SURGICAL PATHOLOGY

## 2016-09-15 ENCOUNTER — Encounter: Payer: Self-pay | Admitting: Obstetrics and Gynecology

## 2017-02-15 NOTE — L&D Delivery Note (Signed)
Obstetrical Delivery Note   Date of Delivery:   09/04/2017 Primary OB:   [facility] Gestational Age/EDD: 7558w5d (Dated by LMP=13wk US) Antepartum complications: obesity and history of macrosomic infants  Delivered By:   Farrel Connersolleen Keiona Jenison, CNM  Delivery Type:   spontaneous vaginal delivery  Procedure Details:   Mother pushed to deliver a vigorous female in [place] with left nuchal hand over an intact perineum. Baby suctioned and dried and placed on mother's abdomen. After a delayed cord clamping the FOB cut the cord. Baby then placed skin to skin with mother. Spontaneous delivery of intact placenta and 3 vessel cord with trailing membranes. Placenta appears to be circumvallte or circumarginate. No vaginal, perineal or cervical lacerations. EBL 350 ml. Anesthesia:    epidural Intrapartum complications: Hypotension after epidural, variable and early decelerations, inadequate labor GBS:    negative Laceration:    none Episiotomy:    none Placenta:    Via active 3rd stage. To pathology: yes, ?circumvallate Estimated Blood Loss:  350 ml Baby:    Liveborn female, Apgars 9/9, weight 8#9oz    Farrel Connersolleen Boone Gear, CNM

## 2017-02-22 ENCOUNTER — Encounter: Payer: Self-pay | Admitting: Maternal Newborn

## 2017-02-22 ENCOUNTER — Ambulatory Visit (INDEPENDENT_AMBULATORY_CARE_PROVIDER_SITE_OTHER): Payer: Medicaid Other | Admitting: Maternal Newborn

## 2017-02-22 VITALS — BP 120/70 | Wt 238.0 lb

## 2017-02-22 DIAGNOSIS — Z3A12 12 weeks gestation of pregnancy: Secondary | ICD-10-CM

## 2017-02-22 DIAGNOSIS — Z3481 Encounter for supervision of other normal pregnancy, first trimester: Secondary | ICD-10-CM | POA: Diagnosis not present

## 2017-02-22 DIAGNOSIS — Z3480 Encounter for supervision of other normal pregnancy, unspecified trimester: Secondary | ICD-10-CM

## 2017-02-22 DIAGNOSIS — Z6834 Body mass index (BMI) 34.0-34.9, adult: Secondary | ICD-10-CM

## 2017-02-22 DIAGNOSIS — Z0189 Encounter for other specified special examinations: Secondary | ICD-10-CM

## 2017-02-22 DIAGNOSIS — Z124 Encounter for screening for malignant neoplasm of cervix: Secondary | ICD-10-CM

## 2017-02-22 NOTE — Patient Instructions (Signed)
First Trimester of Pregnancy The first trimester of pregnancy is from week 1 until the end of week 13 (months 1 through 3). A week after a sperm fertilizes an egg, the egg will implant on the wall of the uterus. This embryo will begin to develop into a baby. Genes from you and your partner will form the baby. The female genes will determine whether the baby will be a boy or a girl. At 6-8 weeks, the eyes and face will be formed, and the heartbeat can be seen on ultrasound. At the end of 12 weeks, all the baby's organs will be formed. Now that you are pregnant, you will want to do everything you can to have a healthy baby. Two of the most important things are to get good prenatal care and to follow your health care provider's instructions. Prenatal care is all the medical care you receive before the baby's birth. This care will help prevent, find, and treat any problems during the pregnancy and childbirth. Body changes during your first trimester Your body goes through many changes during pregnancy. The changes vary from woman to woman.  You may gain or lose a couple of pounds at first.  You may feel sick to your stomach (nauseous) and you may throw up (vomit). If the vomiting is uncontrollable, call your health care provider.  You may tire easily.  You may develop headaches that can be relieved by medicines. All medicines should be approved by your health care provider.  You may urinate more often. Painful urination may mean you have a bladder infection.  You may develop heartburn as a result of your pregnancy.  You may develop constipation because certain hormones are causing the muscles that push stool through your intestines to slow down.  You may develop hemorrhoids or swollen veins (varicose veins).  Your breasts may begin to grow larger and become tender. Your nipples may stick out more, and the tissue that surrounds them (areola) may become darker.  Your gums may bleed and may be  sensitive to brushing and flossing.  Dark spots or blotches (chloasma, mask of pregnancy) may develop on your face. This will likely fade after the baby is born.  Your menstrual periods will stop.  You may have a loss of appetite.  You may develop cravings for certain kinds of food.  You may have changes in your emotions from day to day, such as being excited to be pregnant or being concerned that something may go wrong with the pregnancy and baby.  You may have more vivid and strange dreams.  You may have changes in your hair. These can include thickening of your hair, rapid growth, and changes in texture. Some women also have hair loss during or after pregnancy, or hair that feels dry or thin. Your hair will most likely return to normal after your baby is born.  What to expect at prenatal visits During a routine prenatal visit:  You will be weighed to make sure you and the baby are growing normally.  Your blood pressure will be taken.  Your abdomen will be measured to track your baby's growth.  The fetal heartbeat will be listened to between weeks 10 and 14 of your pregnancy.  Test results from any previous visits will be discussed.  Your health care provider may ask you:  How you are feeling.  If you are feeling the baby move.  If you have had any abnormal symptoms, such as leaking fluid, bleeding, severe headaches,   or abdominal cramping.  If you are using any tobacco products, including cigarettes, chewing tobacco, and electronic cigarettes.  If you have any questions.  Other tests that may be performed during your first trimester include:  Blood tests to find your blood type and to check for the presence of any previous infections. The tests will also be used to check for low iron levels (anemia) and protein on red blood cells (Rh antibodies). Depending on your risk factors, or if you previously had diabetes during pregnancy, you may have tests to check for high blood  sugar that affects pregnant women (gestational diabetes).  Urine tests to check for infections, diabetes, or protein in the urine.  An ultrasound to confirm the proper growth and development of the baby.  Fetal screens for spinal cord problems (spina bifida) and Down syndrome.  HIV (human immunodeficiency virus) testing. Routine prenatal testing includes screening for HIV, unless you choose not to have this test.  You may need other tests to make sure you and the baby are doing well.  Follow these instructions at home: Medicines  Follow your health care provider's instructions regarding medicine use. Specific medicines may be either safe or unsafe to take during pregnancy.  Take a prenatal vitamin that contains at least 600 micrograms (mcg) of folic acid.  If you develop constipation, try taking a stool softener if your health care provider approves. Eating and drinking  Eat a balanced diet that includes fresh fruits and vegetables, whole grains, good sources of protein such as meat, eggs, or tofu, and low-fat dairy. Your health care provider will help you determine the amount of weight gain that is right for you.  Avoid raw meat and uncooked cheese. These carry germs that can cause birth defects in the baby.  Eating four or five small meals rather than three large meals a day may help relieve nausea and vomiting. If you start to feel nauseous, eating a few soda crackers can be helpful. Drinking liquids between meals, instead of during meals, also seems to help ease nausea and vomiting.  Limit foods that are high in fat and processed sugars, such as fried and sweet foods.  To prevent constipation: ? Eat foods that are high in fiber, such as fresh fruits and vegetables, whole grains, and beans. ? Drink enough fluid to keep your urine clear or pale yellow. Activity  Exercise only as directed by your health care provider. Most women can continue their usual exercise routine during  pregnancy. Try to exercise for 30 minutes at least 5 days a week. Exercising will help you: ? Control your weight. ? Stay in shape. ? Be prepared for labor and delivery.  Experiencing pain or cramping in the lower abdomen or lower back is a good sign that you should stop exercising. Check with your health care provider before continuing with normal exercises.  Try to avoid standing for long periods of time. Move your legs often if you must stand in one place for a long time.  Avoid heavy lifting.  Wear low-heeled shoes and practice good posture.  You may continue to have sex unless your health care provider tells you not to. Relieving pain and discomfort  Wear a good support bra to relieve breast tenderness.  Take warm sitz baths to soothe any pain or discomfort caused by hemorrhoids. Use hemorrhoid cream if your health care provider approves.  Rest with your legs elevated if you have leg cramps or low back pain.  If you develop   varicose veins in your legs, wear support hose. Elevate your feet for 15 minutes, 3-4 times a day. Limit salt in your diet. Prenatal care  Schedule your prenatal visits by the twelfth week of pregnancy. They are usually scheduled monthly at first, then more often in the last 2 months before delivery.  Write down your questions. Take them to your prenatal visits.  Keep all your prenatal visits as told by your health care provider. This is important. Safety  Wear your seat belt at all times when driving.  Make a list of emergency phone numbers, including numbers for family, friends, the hospital, and police and fire departments. General instructions  Ask your health care provider for a referral to a local prenatal education class. Begin classes no later than the beginning of month 6 of your pregnancy.  Ask for help if you have counseling or nutritional needs during pregnancy. Your health care provider can offer advice or refer you to specialists for help  with various needs.  Do not use hot tubs, steam rooms, or saunas.  Do not douche or use tampons or scented sanitary pads.  Do not cross your legs for long periods of time.  Avoid cat litter boxes and soil used by cats. These carry germs that can cause birth defects in the baby and possibly loss of the fetus by miscarriage or stillbirth.  Avoid all smoking, herbs, alcohol, and medicines not prescribed by your health care provider. Chemicals in these products affect the formation and growth of the baby.  Do not use any products that contain nicotine or tobacco, such as cigarettes and e-cigarettes. If you need help quitting, ask your health care provider. You may receive counseling support and other resources to help you quit.  Schedule a dentist appointment. At home, brush your teeth with a soft toothbrush and be gentle when you floss. Contact a health care provider if:  You have dizziness.  You have mild pelvic cramps, pelvic pressure, or nagging pain in the abdominal area.  You have persistent nausea, vomiting, or diarrhea.  You have a bad smelling vaginal discharge.  You have pain when you urinate.  You notice increased swelling in your face, hands, legs, or ankles.  You are exposed to fifth disease or chickenpox.  You are exposed to German measles (rubella) and have never had it. Get help right away if:  You have a fever.  You are leaking fluid from your vagina.  You have spotting or bleeding from your vagina.  You have severe abdominal cramping or pain.  You have rapid weight gain or loss.  You vomit blood or material that looks like coffee grounds.  You develop a severe headache.  You have shortness of breath.  You have any kind of trauma, such as from a fall or a car accident. Summary  The first trimester of pregnancy is from week 1 until the end of week 13 (months 1 through 3).  Your body goes through many changes during pregnancy. The changes vary from  woman to woman.  You will have routine prenatal visits. During those visits, your health care provider will examine you, discuss any test results you may have, and talk with you about how you are feeling. This information is not intended to replace advice given to you by your health care provider. Make sure you discuss any questions you have with your health care provider. Document Released: 01/26/2001 Document Revised: 01/14/2016 Document Reviewed: 01/14/2016 Elsevier Interactive Patient Education  2018 Elsevier   Inc.  

## 2017-02-22 NOTE — Progress Notes (Addendum)
02/22/2017   Chief Complaint: Amenorrhea, positive home pregnancy test, desires prenatal care.  Transfer of Care Patient: no  History of Present Illness: Christina Bautista is a 32 y.o. [id number] at [redacted]w[redacted]d based on Patient's last menstrual period on 11/30/2016, with an Estimated Date of Delivery: 09/06/17, with the above CC.   Her periods were: regular periods every 28 days She was using no method when she conceived.  She has Positive signs or symptoms of nausea of pregnancy, which is improving. Does not desire medication at this time. She has Negative signs or symptoms of miscarriage or preterm labor She identifies Negative Zika risk factors for her and her partner On any different medications around the time she conceived/early pregnancy: No, has been taking iron supplement since D&E (09/01/16) about 4 times a week. History of varicella: Yes   ROS: A 12-point review of systems was performed and negative, except as stated in the above HPI.  OBGYN History: As per HPI. OB History  Gravida Para Term Preterm AB Living  6 3 3   2 3   SAB TAB Ectopic Multiple Live Births  2            # Outcome Date GA Lbr Len/2nd Weight Sex Delivery Anes PTL Lv  6 Current           5 SAB           4 SAB           3 Term           2 Term           1 Term               Any issues with any prior pregnancies: yes, SAB G4 and G5, D&E for G5 Any prior children are healthy, doing well, without any problems or issues: yes History of pap smears: Yes. Last pap smear: Patient can't recall History of STIs: No   Past Medical History: History reviewed. No pertinent past medical history.  Past Surgical History: Past Surgical History:  Procedure Laterality Date  . DILATION AND EVACUATION N/A 08/31/2016   Procedure: DILATATION AND EVACUATION;  Surgeon: Vena Austria, MD;  Location: [facility] ORS;  Service: Gynecology;  Laterality: N/A;    Family History:  History reviewed. No pertinent family history. She denies any  female cancers, bleeding or blood clotting disorders.  She denies any history of intellectual disability, birth defects or genetic disorders in her or the FOB's history  Social History:  Social History   Socioeconomic History  . Marital status: Married    Spouse name: Not on file  . Number of children: Not on file  . Years of education: Not on file  . Highest education level: Not on file  Social Needs  . Financial resource strain: Not on file  . Food insecurity - worry: Not on file  . Food insecurity - inability: Not on file  . Transportation needs - medical: Not on file  . Transportation needs - non-medical: Not on file  Occupational History  . Not on file  Tobacco Use  . Smoking status: Never Smoker  . Smokeless tobacco: Never Used  Substance and Sexual Activity  . Alcohol use: No  . Drug use: No  . Sexual activity: Not on file  Other Topics Concern  . Not on file  Social History Narrative  . Not on file   Any cats in the household: no Denies history of and current domestic violence.  Allergy: Allergies  Allergen Reactions  . Codeine Nausea Only and Other (See Comments)    Dizziness, lightheadedness    Current Outpatient Medications:  Current Outpatient Medications:  .  ferrous sulfate (FERROUSUL) 325 (65 FE) MG tablet, Take 1 tablet (325 mg total) by mouth 2 (two) times daily., Disp: 60 tablet, Rfl: 1 .  Prenatal Multivit-Min-Fe-FA (PRENATAL 1 + IRON PO), Take 1 tablet by mouth daily., Disp: , Rfl:    Physical Exam:   BP 120/70   Wt 238 lb (108 kg)   LMP 11/30/2016   BMI 35.15 kg/m  Body mass index is 35.15 kg/m. Constitutional: Well nourished, well developed female in no acute distress.  Neck:  Supple, normal appearance, and no thyromegaly  Cardiovascular: S1, S2 normal, no murmur, rub or gallop, regular rate and rhythm Respiratory:  Clear to auscultation bilaterally. Normal respiratory effort Abdomen: no masses, hernias; diffusely non tender to  palpation, non distended Breasts: breasts appear normal, no suspicious masses, no skin or nipple changes or axillary nodes. Neuro/Psych:  Normal mood and affect.  Skin:  Warm and dry.  Lymphatic:  No inguinal lymphadenopathy.   Pelvic exam: is not limited by body habitus External genitalia, Bartholin's glands, Urethra, Skene's glands: within normal limits Vagina: within normal limits and with no blood in the vault  Cervix: normal appearing cervix without discharge or lesions, closed/long/high Uterus:  enlarged, c/w 12 week size Adnexa:  no mass, fullness, tenderness  Assessment: Christina Bautista is a 32 y.o. [id number] at 5284w0d based on Patient's last menstrual period on 11/30/2016, with an Estimated Date of Delivery: 09/06/17, presenting for prenatal care.  Plan:  1) Avoid alcoholic beverages. 2) Patient encouraged not to smoke.  3) Discontinue the use of all non-medicinal drugs and chemicals.  4) Take prenatal vitamins daily.  5) Seatbelt use advised 6) Nutrition, food safety (fish, cheese advisories, and high nitrite foods) and exercise discussed. 7) [facility]  8) Patient is asked about travel to areas at risk for the Zika virus, and counseled to avoid travel and exposure to mosquitoes or sexual partners who may have themselves been exposed to the virus. Testing is discussed, and will be ordered as appropriate.  9) [facility] advised 10) Genetic Screening, such as with 1st Trimester Screening, cell free fetal DNA, AFP testing, and Ultrasound, as well as with amniocentesis and CVS as appropriate, is discussed with patient. She plans to decline genetic testing this pregnancy. 11) Early GTT for BMI of 34, to return fasting and have drawn with NOB labs. 12) Dating/viabiliry ultrasound ordered.  Problem list reviewed and updated.  Return in about 1 week (around 03/01/2017) for ROB with GTT/labs and ultrasound.  Marcelyn BruinsJacelyn Schmid,  CNM [facility], [facility] 02/22/2017  11:23 AM

## 2017-02-22 NOTE — Progress Notes (Signed)
No [email or web address] 

## 2017-02-24 LAB — URINE DRUG PANEL 7
Amphetamines, Urine: NEGATIVE ng/mL
Barbiturate Quant, Ur: NEGATIVE ng/mL
Benzodiazepine Quant, Ur: NEGATIVE ng/mL
COCAINE (METAB.): NEGATIVE ng/mL
Cannabinoid Quant, Ur: NEGATIVE ng/mL
OPIATE QUANT UR: NEGATIVE ng/mL
PCP QUANT UR: NEGATIVE ng/mL

## 2017-02-24 LAB — URINE CULTURE: Organism ID, Bacteria: NO GROWTH

## 2017-02-25 LAB — PAP IG, CT-NG TV HPV-HR
Chlamydia, Nuc. Acid Amp: NEGATIVE
GONOCOCCUS, NUC. ACID AMP: NEGATIVE
HPV, high-risk: NEGATIVE
PAP Smear Comment: 0
Trich vag by NAA: NEGATIVE

## 2017-03-02 ENCOUNTER — Ambulatory Visit (INDEPENDENT_AMBULATORY_CARE_PROVIDER_SITE_OTHER): Payer: Medicaid Other | Admitting: Advanced Practice Midwife

## 2017-03-02 ENCOUNTER — Ambulatory Visit (INDEPENDENT_AMBULATORY_CARE_PROVIDER_SITE_OTHER): Payer: Medicaid Other

## 2017-03-02 ENCOUNTER — Ambulatory Visit: Payer: Medicaid Other

## 2017-03-02 ENCOUNTER — Encounter: Payer: Self-pay | Admitting: Advanced Practice Midwife

## 2017-03-02 VITALS — BP 130/68 | Wt 238.0 lb

## 2017-03-02 DIAGNOSIS — Z0189 Encounter for other specified special examinations: Secondary | ICD-10-CM

## 2017-03-02 DIAGNOSIS — Z362 Encounter for other antenatal screening follow-up: Secondary | ICD-10-CM

## 2017-03-02 DIAGNOSIS — Z3480 Encounter for supervision of other normal pregnancy, unspecified trimester: Secondary | ICD-10-CM

## 2017-03-02 DIAGNOSIS — Z3A13 13 weeks gestation of pregnancy: Secondary | ICD-10-CM

## 2017-03-02 DIAGNOSIS — Z6834 Body mass index (BMI) 34.0-34.9, adult: Secondary | ICD-10-CM

## 2017-03-02 NOTE — Progress Notes (Signed)
No [email or web address] 

## 2017-03-02 NOTE — Progress Notes (Signed)
  Routine Prenatal Care Visit  Subjective  Christina Bautista is a 32 y.o. [id number] at 4478w1d being seen today for ongoing prenatal care.  She is currently monitored for the following issues for this low-risk pregnancy and has Supervision of other normal pregnancy, antepartum and History of pneumonia on their problem list.  ----------------------------------------------------------------------------------- Patient reports no complaints.   Contractions: Not present. Vag. Bleeding: None.   . Denies leaking of fluid.  ----------------------------------------------------------------------------------- The following portions of the patient's history were reviewed and updated as appropriate: allergies, current medications, past family history, past medical history, past social history, past surgical history and problem list. Problem list updated.   Objective  Blood pressure 130/68, weight 238 lb (108 kg), last menstrual period 11/30/2016. Pregravid weight 235 lb (106.6 kg) Total Weight Gain 3 lb (1.361 kg) Urinalysis: Urine Protein: Negative Urine Glucose: Negative  Fetal Status: Fetal Heart Rate (bpm): 148         Dating scan today agrees with LMP  General:  Alert, oriented and cooperative. Patient is in no acute distress.  Skin: Skin is warm and dry. No rash noted.   Cardiovascular: Normal heart rate noted  Respiratory: Normal respiratory effort, no problems with respiration noted  Abdomen: Soft, gravid, appropriate for gestational age.       Pelvic:  Cervical exam deferred        Extremities: Normal range of motion.  Edema: None  Mental Status: Normal mood and affect. Normal behavior. Normal judgment and thought content.   Assessment   32 y.o. [id number] at 7578w1d by  09/06/2017, by Last Menstrual Period presenting for routine prenatal visit  Plan   sixth Problems (from 02/22/17 to present)    Problem Noted Resolved   Supervision of other normal pregnancy, antepartum 02/22/2017 by Oswaldo ConroySchmid,  Jacelyn Y, CNM No   Overview Addendum 02/28/2017  8:19 AM by Oswaldo ConroySchmid, Jacelyn Y, CNM    Clinic [facility] Prenatal Labs  Dating By LMP = 13 wk u/s Blood type: --/--/A POS (07/17 1317)   Genetic Screen 1 Screen:    AFP:     Quad:     NIPS: screening declined Antibody:NEG (07/17 1317)  Anatomic US  Rubella:   Varicella:    GTT Early:[ ] Third trimester:  RPR:     Rhogam  HBsAg:     TDaP vaccine    Flu Shot: HIV:     Baby Food                                GBS:   Contraception  Pap: 02/22/2017, NILM and HPV Neg  CBB     CS/VBAC NA   Support Person Husband Molly MaduroRobert                 Preterm labor symptoms and general obstetric precautions including but not limited to vaginal bleeding, contractions, leaking of fluid and fetal movement were reviewed in detail with the patient.   Return in about 4 weeks (around 03/30/2017) for rob.  [name], CNM  03/02/2017 11:38 AM

## 2017-03-04 LAB — HEMOGLOBINOPATHY EVALUATION
HGB C: 0 %
HGB S: 0 %
HGB VARIANT: 0 %
Hemoglobin A2 Quantitation: 1.9 % (ref 1.8–3.2)
Hemoglobin F Quantitation: 0 % (ref 0.0–2.0)
Hgb A: 98.1 % (ref 96.4–98.8)

## 2017-03-04 LAB — RPR+RH+ABO+RUB AB+AB SCR+CB...
Antibody Screen: NEGATIVE
HIV SCREEN 4TH GENERATION: NONREACTIVE
Hematocrit: 36.5 % (ref 34.0–46.6)
Hemoglobin: 12.1 g/dL (ref 11.1–15.9)
Hepatitis B Surface Ag: NEGATIVE
MCH: 29.1 pg (ref 26.6–33.0)
MCHC: 33.2 g/dL (ref 31.5–35.7)
MCV: 88 fL (ref 79–97)
PLATELETS: 188 10*3/uL (ref 150–379)
RBC: 4.16 x10E6/uL (ref 3.77–5.28)
RDW: 17.7 % — ABNORMAL HIGH (ref 12.3–15.4)
RPR: NONREACTIVE
RUBELLA: 5.64 {index} (ref >0.99)
Rh Factor: POSITIVE
VARICELLA: 3473 {index} (ref >165)
WBC: 8.1 10*3/uL (ref 3.4–10.8)

## 2017-03-04 LAB — GLUCOSE, 1 HOUR GESTATIONAL: Gestational Diabetes Screen: 119 mg/dL (ref 65–139)

## 2017-03-30 ENCOUNTER — Ambulatory Visit (INDEPENDENT_AMBULATORY_CARE_PROVIDER_SITE_OTHER): Payer: Medicaid Other | Admitting: Obstetrics & Gynecology

## 2017-03-30 ENCOUNTER — Encounter: Payer: Self-pay | Admitting: Obstetrics & Gynecology

## 2017-03-30 VITALS — BP 120/80 | Wt 241.0 lb

## 2017-03-30 DIAGNOSIS — Z3A17 17 weeks gestation of pregnancy: Secondary | ICD-10-CM

## 2017-03-30 DIAGNOSIS — Z3480 Encounter for supervision of other normal pregnancy, unspecified trimester: Secondary | ICD-10-CM

## 2017-03-30 NOTE — Patient Instructions (Signed)

## 2017-03-30 NOTE — Progress Notes (Signed)
  Subjective  Fetal Movement? yes Contractions? no Leaking Fluid? no Vaginal Bleeding? no No complaints; fourth baby.  Prior [place] at [place] and [place], no problems. Objective  BP 120/80   Wt 241 lb (109.3 kg)   LMP 11/30/2016   BMI 35.59 kg/m  General: NAD Pumonary: no increased work of breathing Abdomen: gravid, non-tender Extremities: no edema Psychiatric: mood appropriate, affect full  Assessment  32 y.o. [id number] at 3393w1d by  09/06/2017, by Last Menstrual Period presenting for routine prenatal visit  Plan   Problem List Items Addressed This Visit      Other   Supervision of other normal pregnancy, antepartum    Other Visit Diagnoses    [redacted] weeks gestation of pregnancy    -  Primary    US nv PNV [facility].  Prefers PP BTL.    (plam papers 29 weeks)  Annamarie MajorPaul Harris, MD, Merlinda FrederickFACOG [facility], [facility] 03/30/2017  10:10 AM

## 2017-04-19 ENCOUNTER — Other Ambulatory Visit: Payer: Self-pay | Admitting: Obstetrics & Gynecology

## 2017-04-19 DIAGNOSIS — Z363 Encounter for antenatal screening for malformations: Secondary | ICD-10-CM

## 2017-04-20 ENCOUNTER — Ambulatory Visit (INDEPENDENT_AMBULATORY_CARE_PROVIDER_SITE_OTHER): Payer: Medicaid Other | Admitting: Advanced Practice Midwife

## 2017-04-20 ENCOUNTER — Encounter: Payer: Self-pay | Admitting: Advanced Practice Midwife

## 2017-04-20 ENCOUNTER — Ambulatory Visit (INDEPENDENT_AMBULATORY_CARE_PROVIDER_SITE_OTHER): Payer: Medicaid Other

## 2017-04-20 VITALS — BP 124/74 | Wt 245.0 lb

## 2017-04-20 DIAGNOSIS — Z3A20 20 weeks gestation of pregnancy: Secondary | ICD-10-CM | POA: Diagnosis not present

## 2017-04-20 DIAGNOSIS — Z363 Encounter for antenatal screening for malformations: Secondary | ICD-10-CM

## 2017-04-20 DIAGNOSIS — Z3480 Encounter for supervision of other normal pregnancy, unspecified trimester: Secondary | ICD-10-CM

## 2017-04-20 DIAGNOSIS — Z0489 Encounter for examination and observation for other specified reasons: Secondary | ICD-10-CM

## 2017-04-20 DIAGNOSIS — IMO0002 Reserved for concepts with insufficient information to code with codable children: Secondary | ICD-10-CM

## 2017-04-20 NOTE — Progress Notes (Signed)
Anatomy scan today. No vb. No lof.  ?

## 2017-04-20 NOTE — Progress Notes (Signed)
  Routine Prenatal Care Visit  Subjective  Christina Bautista is a 32 y.o. [phone] at [redacted]w[redacted]d being seen today for ongoing prenatal care.  She is currently monitored for the following issues for this low-risk pregnancy and has Supervision of other normal pregnancy, antepartum and History of pneumonia on their problem list.  ----------------------------------------------------------------------------------- Patient reports no complaints.   Contractions: Not present. Vag. Bleeding: None.  Movement: Present. Denies leaking of fluid.  ----------------------------------------------------------------------------------- The following portions of the patient's history were reviewed and updated as appropriate: allergies, current medications, past family history, past medical history, past social history, past surgical history and problem list. Problem list updated.   Objective  Blood pressure 124/74, weight 245 lb (111.1 kg), last menstrual period 11/30/2016. Pregravid weight 235 lb (106.6 kg) Total Weight Gain 10 lb (4.536 kg) Urinalysis: Urine Protein: Negative Urine Glucose: Negative  Fetal Status: Fetal Heart Rate (bpm): 143   Movement: Present     Anatomy scan today is sub-optimal for face and otherwise complete  General:  Alert, oriented and cooperative. Patient is in no acute distress.  Skin: Skin is warm and dry. No rash noted.   Cardiovascular: Normal heart rate noted  Respiratory: Normal respiratory effort, no problems with respiration noted  Abdomen: Soft, gravid, appropriate for gestational age. Pain/Pressure: Absent     Pelvic:  Cervical exam deferred        Extremities: Normal range of motion.  Edema: None  Mental Status: Normal mood and affect. Normal behavior. Normal judgment and thought content.   Assessment   32 y.o. [id number] at 589w1d by  09/06/2017, by Last Menstrual Period presenting for routine prenatal visit  Plan   sixth Problems (from 02/22/17 to present)    Problem Noted  Resolved   Supervision of other normal pregnancy, antepartum 02/22/2017 by Oswaldo ConroySchmid, Jacelyn Y, CNM No   Overview Addendum 03/04/2017  1:57 PM by Oswaldo ConroySchmid, Jacelyn Y, CNM    [facility]inic [facility] Prenatal Labs  Dating  Blood type: --/--/A POS (07/17 1317)   Genetic Screen 1 Screen:    AFP:     Quad:     NIPS: Antibody:NEG (07/17 1317)  Anatomic US  Rubella:   Varicella: Immune  GTT Early: 119               Third trimester:  RPR:     Rhogam  HBsAg:     TDaP vaccine                       Flu Shot: HIV:     Baby Food                                GBS:   Contraception Desires BTL Pap: 02/22/2017, NILM and HPV Neg  CBB     CS/VBAC    Support Person Husband Molly MaduroRobert                 Preterm labor symptoms and general obstetric precautions including but not limited to vaginal bleeding, contractions, leaking of fluid and fetal movement were reviewed in detail with the patient.   Return in about 4 weeks (around 05/18/2017) for follow up anatomy and rob.  Christina Bautista, CNM 04/20/2017 11:46 AM

## 2017-05-18 ENCOUNTER — Other Ambulatory Visit: Payer: Medicaid Other

## 2017-05-18 ENCOUNTER — Encounter: Payer: Medicaid Other | Admitting: Obstetrics and Gynecology

## 2017-05-27 ENCOUNTER — Encounter: Payer: Medicaid Other | Admitting: Advanced Practice Midwife

## 2017-05-27 ENCOUNTER — Other Ambulatory Visit: Payer: Medicaid Other

## 2017-06-08 ENCOUNTER — Ambulatory Visit (INDEPENDENT_AMBULATORY_CARE_PROVIDER_SITE_OTHER): Payer: Medicaid Other

## 2017-06-08 ENCOUNTER — Ambulatory Visit (INDEPENDENT_AMBULATORY_CARE_PROVIDER_SITE_OTHER): Payer: Medicaid Other | Admitting: Obstetrics and Gynecology

## 2017-06-08 ENCOUNTER — Encounter: Payer: Self-pay | Admitting: Obstetrics and Gynecology

## 2017-06-08 VITALS — BP 114/64 | Wt 248.0 lb

## 2017-06-08 DIAGNOSIS — Z3480 Encounter for supervision of other normal pregnancy, unspecified trimester: Secondary | ICD-10-CM | POA: Diagnosis not present

## 2017-06-08 DIAGNOSIS — Z0489 Encounter for examination and observation for other specified reasons: Secondary | ICD-10-CM

## 2017-06-08 DIAGNOSIS — Z3A27 27 weeks gestation of pregnancy: Secondary | ICD-10-CM

## 2017-06-08 NOTE — Progress Notes (Signed)
Pt reports no problems. F/u anatomy scan today.  

## 2017-06-08 NOTE — Progress Notes (Signed)
Routine Prenatal Care Visit  Subjective  Christina Bautista is a 32 y.o. [phone] at 2940w1d being seen today for ongoing prenatal care.  She is currently monitored for the following issues for this low-risk pregnancy and has Supervision of other normal pregnancy, antepartum and History of pneumonia on their problem list.  ----------------------------------------------------------------------------------- Patient reports no complaints.   Contractions: Not present. Vag. Bleeding: None.  Movement: Present. Denies leaking of fluid.  ----------------------------------------------------------------------------------- The following portions of the patient's history were reviewed and updated as appropriate: allergies, current medications, past family history, past medical history, past social history, past surgical history and problem list. Problem list updated.   Objective  Blood pressure 114/64, weight 248 lb (112.5 kg), last menstrual period 11/30/2016. Pregravid weight 235 lb (106.6 kg) Total Weight Gain 13 lb (5.897 kg) Urinalysis: Urine Protein: Negative Urine Glucose: Negative  Fetal Status: Fetal Heart Rate (bpm): 145 Fundal Height: 28 cm Movement: Present     General:  Alert, oriented and cooperative. Patient is in no acute distress.  Skin: Skin is warm and dry. No rash noted.   Cardiovascular: Normal heart rate noted  Respiratory: Normal respiratory effort, no problems with respiration noted  Abdomen: Soft, gravid, appropriate for gestational age. Pain/Pressure: Absent     Pelvic:  Cervical exam deferred        Extremities: Normal range of motion.     ental Status: Normal mood and affect. Normal behavior. Normal judgment and thought content.     Assessment   32 y.o. [id number] at 2640w1d by  09/06/2017, by Last Menstrual Period presenting for routine prenatal visit  Plan   sixth Problems (from 02/22/17 to present)    Problem Noted Resolved   Supervision of other normal pregnancy,  antepartum 02/22/2017 by Oswaldo ConroySchmid, Jacelyn Y, CNM No   Overview Addendum 06/08/2017  3:03 PM by Natale MilchSchuman, Hubbert Landrigan R, MD    [facility]inic [facility] Prenatal Labs  Dating  LMP= 13wk US Blood type: A/Positive/-- (01/16 1147)   Genetic Screen 1 Screen:    AFP:     Quad:     NIPS: Antibody:Negative (01/16 1147)  Anatomic US Complete Rubella: 5.64 (01/16 1147) Varicella: Immune  GTT Early: 119                Third trimester:  RPR: Non Reactive (01/16 1147)   Rhogam  not applicable HBsAg: Negative (01/16 1147)   TDaP vaccine                        Flu Shot: HIV: Non Reactive (01/16 1147)   Baby Food  Breast                              GBS:   Contraception  IUD vs Tubal Pap: 02/22/2017, NILM and HPV Neg  CBB  Given information   CS/VBAC  Not applicable   Support Person                  Gestational age appropriate obstetric precautions including but not limited to vaginal bleeding, contractions, leaking of fluid and fetal movement were reviewed in detail with the patient.    Given information on prenatal classes with [facility].  Given information on birthcontrol options postpartum. Recommended [email or web address]. Patient was going to have a tubal but now her partner would like more children, she is considering an IUD.  Given information on cord blood banking. Patient will return in  1 week for 28 week labs  Return in about 1 week (around 06/15/2017) for ROB ans 1GTT.  Adelene Idler MD [facility], [facility] 06/08/2017, 3:04 PM

## 2017-06-15 ENCOUNTER — Other Ambulatory Visit: Payer: Medicaid Other

## 2017-06-15 ENCOUNTER — Encounter: Payer: Self-pay | Admitting: Advanced Practice Midwife

## 2017-06-15 ENCOUNTER — Ambulatory Visit (INDEPENDENT_AMBULATORY_CARE_PROVIDER_SITE_OTHER): Payer: Medicaid Other | Admitting: Advanced Practice Midwife

## 2017-06-15 VITALS — BP 128/80 | Wt 249.0 lb

## 2017-06-15 DIAGNOSIS — Z3480 Encounter for supervision of other normal pregnancy, unspecified trimester: Secondary | ICD-10-CM

## 2017-06-15 DIAGNOSIS — Z3A28 28 weeks gestation of pregnancy: Secondary | ICD-10-CM

## 2017-06-15 NOTE — Progress Notes (Signed)
  Routine Prenatal Care Visit  Subjective  Christina Bautista is a 32 y.o. [phone] at [redacted]w[redacted]d being seen today for ongoing prenatal care.  She is currently monitored for the following issues for this low-risk pregnancy and has Supervision of other normal pregnancy, antepartum and History of pneumonia on their problem list.  ----------------------------------------------------------------------------------- Patient reports no complaints.   Contractions: Not present. Vag. Bleeding: None.  Movement: Present. Denies leaking of fluid.  ----------------------------------------------------------------------------------- The following portions of the patient's history were reviewed and updated as appropriate: allergies, current medications, past family history, past medical history, past social history, past surgical history and problem list. Problem list updated.   Objective  Blood pressure 128/80, weight 249 lb (112.9 kg), last menstrual period 11/30/2016. Pregravid weight 235 lb (106.6 kg) Total Weight Gain 14 lb (6.35 kg) Urinalysis: Urine Protein: Negative Urine Glucose: Negative  Fetal Status: Fetal Heart Rate (bpm): 144 Fundal Height: 29 cm Movement: Present     General:  Alert, oriented and cooperative. Patient is in no acute distress.  Skin: Skin is warm and dry. No rash noted.   Cardiovascular: Normal heart rate noted  Respiratory: Normal respiratory effort, no problems with respiration noted  Abdomen: Soft, gravid, appropriate for gestational age. Pain/Pressure: Absent     Pelvic:  Cervical exam deferred        Extremities: Normal range of motion.  Edema: None  Mental Status: Normal mood and affect. Normal behavior. Normal judgment and thought content.   Assessment   32 y.o. [id number] at [redacted]w[redacted]d by  09/06/2017, by Last Menstrual Period presenting for routine prenatal visit  Plan   sixth Problems (from 02/22/17 to present)    Problem Noted Resolved   Supervision of other normal pregnancy,  antepartum 02/22/2017 by Oswaldo Conroy, CNM No   Overview Addendum 06/08/2017  3:03 PM by Natale Milch, MD    [facility]inic [facility] Prenatal Labs  Dating  LMP= 13wk [place] Blood type: A/Positive/-- (01/16 1147)   Genetic Screen 1 Screen:    AFP:     Quad:     NIPS: Antibody:Negative (01/16 1147)  Anatomic [place] Complete Rubella: 5.64 (01/16 1147) Varicella: Immune  GTT Early: 119                Third trimester:  RPR: Non Reactive (01/16 1147)   Rhogam  not applicable HBsAg: Negative (01/16 1147)   TDaP vaccine                        Flu Shot: HIV: Non Reactive (01/16 1147)   Baby Food  Breast                              GBS:   Contraception  IUD vs Tubal Pap: 02/22/2017, NILM and HPV Neg  CBB  Given information   CS/VBAC  Not applicable   Support Person                  Preterm labor symptoms and general obstetric precautions including but not limited to vaginal bleeding, contractions, leaking of fluid and fetal movement were reviewed in detail with the patient. Please refer to After Visit Summary for other counseling recommendations.   Return in about 2 weeks (around 06/29/2017) for ROB .  Tresea Mall, CNM 06/15/2017 11:26 AM

## 2017-06-15 NOTE — Patient Instructions (Signed)
Third Trimester of Pregnancy The third trimester is from week 28 through week 40 (months 7 through 9). The third trimester is a time when the unborn baby (fetus) is growing rapidly. At the end of the ninth month, the fetus is about 20 inches in length and weighs 6-10 pounds. Body changes during your third trimester Your body will continue to go through many changes during pregnancy. The changes vary from woman to woman. During the third trimester:  Your weight will continue to increase. You can expect to gain 25-35 pounds (11-16 kg) by the end of the pregnancy.  You may begin to get stretch marks on your hips, abdomen, and breasts.  You may urinate more often because the fetus is moving lower into your pelvis and pressing on your bladder.  You may develop or continue to have heartburn. This is caused by increased hormones that slow down muscles in the digestive tract.  You may develop or continue to have constipation because increased hormones slow digestion and cause the muscles that push waste through your intestines to relax.  You may develop hemorrhoids. These are swollen veins (varicose veins) in the rectum that can itch or be painful.  You may develop swollen, bulging veins (varicose veins) in your legs.  You may have increased body aches in the pelvis, back, or thighs. This is due to weight gain and increased hormones that are relaxing your joints.  You may have changes in your hair. These can include thickening of your hair, rapid growth, and changes in texture. Some women also have hair loss during or after pregnancy, or hair that feels dry or thin. Your hair will most likely return to normal after your baby is born.  Your breasts will continue to grow and they will continue to become tender. A yellow fluid (colostrum) may leak from your breasts. This is the first milk you are producing for your baby.  Your belly button may stick out.  You may notice more swelling in your hands,  face, or ankles.  You may have increased tingling or numbness in your hands, arms, and legs. The skin on your belly may also feel numb.  You may feel short of breath because of your expanding uterus.  You may have more problems sleeping. This can be caused by the size of your belly, increased need to urinate, and an increase in your body's metabolism.  You may notice the fetus "dropping," or moving lower in your abdomen (lightening).  You may have increased vaginal discharge.  You may notice your joints feel loose and you may have pain around your pelvic bone.  What to expect at prenatal visits You will have prenatal exams every 2 weeks until week 36. Then you will have weekly prenatal exams. During a routine prenatal visit:  You will be weighed to make sure you and the baby are growing normally.  Your blood pressure will be taken.  Your abdomen will be measured to track your baby's growth.  The fetal heartbeat will be listened to.  Any test results from the previous visit will be discussed.  You may have a cervical check near your due date to see if your cervix has softened or thinned (effaced).  You will be tested for Group B streptococcus. This happens between 35 and 37 weeks.  Your health care provider may ask you:  What your birth plan is.  How you are feeling.  If you are feeling the baby move.  If you have had   any abnormal symptoms, such as leaking fluid, bleeding, severe headaches, or abdominal cramping.  If you are using any tobacco products, including cigarettes, chewing tobacco, and electronic cigarettes.  If you have any questions.  Other tests or screenings that may be performed during your third trimester include:  Blood tests that check for low iron levels (anemia).  Fetal testing to check the health, activity level, and growth of the fetus. Testing is done if you have certain medical conditions or if there are problems during the  pregnancy.  Nonstress test (NST). This test checks the health of your baby to make sure there are no signs of problems, such as the baby not getting enough oxygen. During this test, a belt is placed around your belly. The baby is made to move, and its heart rate is monitored during movement.  What is false labor? False labor is a condition in which you feel small, irregular tightenings of the muscles in the womb (contractions) that usually go away with rest, changing position, or drinking water. These are called Braxton Hicks contractions. Contractions may last for hours, days, or even weeks before true labor sets in. If contractions come at regular intervals, become more frequent, increase in intensity, or become painful, you should see your health care provider. What are the signs of labor?  Abdominal cramps.  Regular contractions that start at 10 minutes apart and become stronger and more frequent with time.  Contractions that start on the top of the uterus and spread down to the lower abdomen and back.  Increased pelvic pressure and dull back pain.  A watery or bloody mucus discharge that comes from the vagina.  Leaking of amniotic fluid. This is also known as your "water breaking." It could be a slow trickle or a gush. Let your health care provider know if it has a color or strange odor. If you have any of these signs, call your health care provider right away, even if it is before your due date. Follow these instructions at home: Medicines  Follow your health care provider's instructions regarding medicine use. Specific medicines may be either safe or unsafe to take during pregnancy.  Take a prenatal vitamin that contains at least 600 micrograms (mcg) of folic acid.  If you develop constipation, try taking a stool softener if your health care provider approves. Eating and drinking  Eat a balanced diet that includes fresh fruits and vegetables, whole grains, good sources of protein  such as meat, eggs, or tofu, and low-fat dairy. Your health care provider will help you determine the amount of weight gain that is right for you.  Avoid raw meat and uncooked cheese. These carry germs that can cause birth defects in the baby.  If you have low calcium intake from food, talk to your health care provider about whether you should take a daily calcium supplement.  Eat four or five small meals rather than three large meals a day.  Limit foods that are high in fat and processed sugars, such as fried and sweet foods.  To prevent constipation: ? Drink enough fluid to keep your urine clear or pale yellow. ? Eat foods that are high in fiber, such as fresh fruits and vegetables, whole grains, and beans. Activity  Exercise only as directed by your health care provider. Most women can continue their usual exercise routine during pregnancy. Try to exercise for 30 minutes at least 5 days a week. Stop exercising if you experience uterine contractions.  Avoid heavy   lifting.  Do not exercise in extreme heat or humidity, or at high altitudes.  Wear low-heel, comfortable shoes.  Practice good posture.  You may continue to have sex unless your health care provider tells you otherwise. Relieving pain and discomfort  Take frequent breaks and rest with your legs elevated if you have leg cramps or low back pain.  Take warm sitz baths to soothe any pain or discomfort caused by hemorrhoids. Use hemorrhoid cream if your health care provider approves.  Wear a good support bra to prevent discomfort from breast tenderness.  If you develop varicose veins: ? Wear support pantyhose or compression stockings as told by your healthcare provider. ? Elevate your feet for 15 minutes, 3-4 times a day. Prenatal care  Write down your questions. Take them to your prenatal visits.  Keep all your prenatal visits as told by your health care provider. This is important. Safety  Wear your seat belt at  all times when driving.  Make a list of emergency phone numbers, including numbers for family, friends, the hospital, and police and fire departments. General instructions  Avoid cat litter boxes and soil used by cats. These carry germs that can cause birth defects in the baby. If you have a cat, ask someone to clean the litter box for you.  Do not travel far distances unless it is absolutely necessary and only with the approval of your health care provider.  Do not use hot tubs, steam rooms, or saunas.  Do not drink alcohol.  Do not use any products that contain nicotine or tobacco, such as cigarettes and e-cigarettes. If you need help quitting, ask your health care provider.  Do not use any medicinal herbs or unprescribed drugs. These chemicals affect the formation and growth of the baby.  Do not douche or use tampons or scented sanitary pads.  Do not cross your legs for long periods of time.  To prepare for the arrival of your baby: ? Take prenatal classes to understand, practice, and ask questions about labor and delivery. ? Make a trial run to the hospital. ? Visit the hospital and tour the maternity area. ? Arrange for maternity or paternity leave through employers. ? Arrange for family and friends to take care of pets while you are in the hospital. ? Purchase a rear-facing car seat and make sure you know how to install it in your car. ? Pack your hospital bag. ? Prepare the baby's nursery. Make sure to remove all pillows and stuffed animals from the baby's crib to prevent suffocation.  Visit your dentist if you have not gone during your pregnancy. Use a soft toothbrush to brush your teeth and be gentle when you floss. Contact a health care provider if:  You are unsure if you are in labor or if your water has broken.  You become dizzy.  You have mild pelvic cramps, pelvic pressure, or nagging pain in your abdominal area.  You have lower back pain.  You have persistent  nausea, vomiting, or diarrhea.  You have an unusual or bad smelling vaginal discharge.  You have pain when you urinate. Get help right away if:  Your water breaks before 37 weeks.  You have regular contractions less than 5 minutes apart before 37 weeks.  You have a fever.  You are leaking fluid from your vagina.  You have spotting or bleeding from your vagina.  You have severe abdominal pain or cramping.  You have rapid weight loss or weight gain.    You have shortness of breath with chest pain.  You notice sudden or extreme swelling of your face, hands, ankles, feet, or legs.  Your baby makes fewer than 10 movements in 2 hours.  You have severe headaches that do not go away when you take medicine.  You have vision changes. Summary  The third trimester is from week 28 through week 40, months 7 through 9. The third trimester is a time when the unborn baby (fetus) is growing rapidly.  During the third trimester, your discomfort may increase as you and your baby continue to gain weight. You may have abdominal, leg, and back pain, sleeping problems, and an increased need to urinate.  During the third trimester your breasts will keep growing and they will continue to become tender. A yellow fluid (colostrum) may leak from your breasts. This is the first milk you are producing for your baby.  False labor is a condition in which you feel small, irregular tightenings of the muscles in the womb (contractions) that eventually go away. These are called Braxton Hicks contractions. Contractions may last for hours, days, or even weeks before true labor sets in.  Signs of labor can include: abdominal cramps; regular contractions that start at 10 minutes apart and become stronger and more frequent with time; watery or bloody mucus discharge that comes from the vagina; increased pelvic pressure and dull back pain; and leaking of amniotic fluid. This information is not intended to replace advice  given to you by your health care provider. Make sure you discuss any questions you have with your health care provider. Document Released: 01/26/2001 Document Revised: 07/10/2015 Document Reviewed: 04/04/2012 Elsevier Interactive Patient Education  2017 [employer]  

## 2017-06-16 LAB — 28 WEEK RH+PANEL
BASOS ABS: 0 10*3/uL (ref 0.0–0.2)
Basos: 0 %
EOS (ABSOLUTE): 0 10*3/uL (ref 0.0–0.4)
EOS: 1 %
Gestational Diabetes Screen: 102 mg/dL (ref 65–139)
HEMATOCRIT: 35.4 % (ref 34.0–46.6)
HIV Screen 4th Generation wRfx: NONREACTIVE
Hemoglobin: 11.8 g/dL (ref 11.1–15.9)
IMMATURE GRANULOCYTES: 0 %
Immature Grans (Abs): 0 10*3/uL (ref 0.0–0.1)
Lymphocytes Absolute: 1.4 10*3/uL (ref 0.7–3.1)
Lymphs: 20 %
MCH: 32.1 pg (ref 26.6–33.0)
MCHC: 33.3 g/dL (ref 31.5–35.7)
MCV: 96 fL (ref 79–97)
MONOS ABS: 0.4 10*3/uL (ref 0.1–0.9)
Monocytes: 6 %
NEUTROS PCT: 73 %
Neutrophils Absolute: 4.9 10*3/uL (ref 1.4–7.0)
PLATELETS: 181 10*3/uL (ref 150–379)
RBC: 3.68 x10E6/uL — ABNORMAL LOW (ref 3.77–5.28)
RDW: 13.7 % (ref 12.3–15.4)
RPR: NONREACTIVE
WBC: 6.7 10*3/uL (ref 3.4–10.8)

## 2017-06-20 NOTE — Progress Notes (Signed)
28 week labs normal, released to mychart.

## 2017-06-29 ENCOUNTER — Encounter: Payer: Medicaid Other | Admitting: Maternal Newborn

## 2017-07-13 ENCOUNTER — Encounter: Payer: Medicaid Other | Admitting: Advanced Practice Midwife

## 2017-07-18 ENCOUNTER — Encounter: Payer: Self-pay | Admitting: Obstetrics and Gynecology

## 2017-07-18 ENCOUNTER — Ambulatory Visit (INDEPENDENT_AMBULATORY_CARE_PROVIDER_SITE_OTHER): Payer: Medicaid Other | Admitting: Obstetrics and Gynecology

## 2017-07-18 VITALS — BP 120/72 | Wt 252.0 lb

## 2017-07-18 DIAGNOSIS — Z3480 Encounter for supervision of other normal pregnancy, unspecified trimester: Secondary | ICD-10-CM

## 2017-07-18 DIAGNOSIS — Z23 Encounter for immunization: Secondary | ICD-10-CM

## 2017-07-18 DIAGNOSIS — Z3A32 32 weeks gestation of pregnancy: Secondary | ICD-10-CM

## 2017-07-18 DIAGNOSIS — Z3483 Encounter for supervision of other normal pregnancy, third trimester: Secondary | ICD-10-CM

## 2017-07-18 MED ORDER — TETANUS-DIPHTH-ACELL PERTUSSIS 5-2.5-18.5 LF-MCG/0.5 IM SUSP
0.5000 mL | Freq: Once | INTRAMUSCULAR | Status: AC
  Administered 2017-07-18: 0.5 mL via INTRAMUSCULAR

## 2017-07-18 NOTE — Progress Notes (Incomplete)
  Routine Prenatal Care Visit  Subjective  Christina Bautista is a 32 y.o. [phone] at 4034w6d being seen today for ongoing prenatal care.  She is currently monitored for the following issues for this {Blank single:19197::"high-risk","low-risk"} pregnancy and has Supervision of other normal pregnancy, antepartum and History of pneumonia on their problem list.  ----------------------------------------------------------------------------------- Patient reports {sx:14538}.   Contractions: Not present. Vag. Bleeding: None.  Movement: Present. Denies leaking of fluid.  ----------------------------------------------------------------------------------- The following portions of the patient's history were reviewed and updated as appropriate: allergies, current medications, past family history, past medical history, past social history, past surgical history and problem list. Problem list updated.   Objective  Blood pressure 120/72, weight 252 lb (114.3 kg), last menstrual period 11/30/2016. Pregravid weight 235 lb (106.6 kg) Total Weight Gain 17 lb (7.711 kg) Urinalysis:      Fetal Status: Fetal Heart Rate (bpm): 145 Fundal Height: 32 cm Movement: Present     General:  Alert, oriented and cooperative. Patient is in no acute distress.  Skin: Skin is warm and dry. No rash noted.   Cardiovascular: Normal heart rate noted  Respiratory: Normal respiratory effort, no problems with respiration noted  Abdomen: Soft, gravid, appropriate for gestational age. Pain/Pressure: Absent     Pelvic:  {Blank single:19197::"Cervical exam performed","Cervical exam deferred"}        Extremities: Normal range of motion.  Edema: None  Mental Status: Normal mood and affect. Normal behavior. Normal judgment and thought content.   Assessment   32 y.o. [id number] at 5234w6d by  09/06/2017, by Last Menstrual Period presenting for {Blank single:19197::"routine","work-in"} prenatal visit  Plan   sixth Problems (from 02/22/17 to  present)    Problem Noted Resolved   Supervision of other normal pregnancy, antepartum 02/22/2017 by Oswaldo ConroySchmid, Jacelyn Y, CNM No   Overview Addendum 06/08/2017  3:03 PM by Natale MilchSchuman, Christanna R, MD    [facility] Prenatal Labs  Dating  LMP= 13wk US Blood type: A/Positive/-- (01/16 1147)   Genetic Screen 1 Screen:    AFP:     Quad:     NIPS: Antibody:Negative (01/16 1147)  Anatomic US Complete Rubella: 5.64 (01/16 1147) Varicella: Immune  GTT Early: 119                Third trimester:  RPR: Non Reactive (01/16 1147)   Rhogam  not applicable HBsAg: Negative (01/16 1147)   TDaP vaccine                        Flu Shot: HIV: Non Reactive (01/16 1147)   Baby Food  Breast                              GBS:   Contraception  IUD vs Tubal Pap: 02/22/2017, NILM and HPV Neg  CBB  Given information   CS/VBAC  Not applicable   Support Person                  {Blank single:19197::"Term","Preterm"} labor symptoms and general obstetric precautions including but not limited to vaginal bleeding, contractions, leaking of fluid and fetal movement were reviewed in detail with the patient. Please refer to After Visit Summary for other counseling recommendations.   Return in about 2 weeks (around 08/01/2017) for Routine Prenatal Appointment.  Thomasene MohairStephen Vinod Mikesell, MD, Merlinda FrederickFACOG [facility], [facility] 07/18/2017 4:13 PM

## 2017-07-18 NOTE — Addendum Note (Signed)
Addended by: Kathlene CoteSTANLEY, Nikita Surman G on: 07/18/2017 04:21 PM   Modules accepted: Orders

## 2017-07-18 NOTE — Patient Instructions (Signed)

## 2017-07-18 NOTE — Progress Notes (Signed)
  Routine Prenatal Care Visit  Subjective  Christina Bautista is a 32 Bautista.o. [phone] at 7153w6d being seen today for ongoing prenatal care.  She is currently monitored for the following issues for this low-risk pregnancy and has Supervision of other normal pregnancy, antepartum and History of pneumonia on their problem list.  ----------------------------------------------------------------------------------- Patient reports no complaints.   Contractions: Not present. Vag. Bleeding: None.  Movement: Present. Denies leaking of fluid.  ----------------------------------------------------------------------------------- The following portions of the patient's history were reviewed and updated as appropriate: allergies, current medications, past family history, past medical history, past social history, past surgical history and problem list. Problem list updated.   Objective  Blood pressure 120/72, weight 252 lb (114.3 kg), last menstrual period 11/30/2016. Pregravid weight 235 lb (106.6 kg) Total Weight Gain 17 lb (7.711 kg) Urinalysis:      Fetal Status: Fetal Heart Rate (bpm): 145 Fundal Height: 32 cm Movement: Present     General:  Alert, oriented and cooperative. Patient is in no acute distress.  Skin: Skin is warm and dry. No rash noted.   Cardiovascular: Normal heart rate noted  Respiratory: Normal respiratory effort, no problems with respiration noted  Abdomen: Soft, gravid, appropriate for gestational age. Pain/Pressure: Absent     Pelvic:  Cervical exam deferred        Extremities: Normal range of motion.  Edema: None  Mental Status: Normal mood and affect. Normal behavior. Normal judgment and thought content.   Assessment   32 Bautista.o. [id number] at [redacted]w[redacted]d by  09/06/2017, by Last Menstrual Period presenting for routine prenatal visit  Plan   sixth Problems (from 02/22/17 to present)    Problem Noted Resolved   Supervision of other normal pregnancy, antepartum 02/22/2017 by Oswaldo ConroySchmid, Christina Bautista,  CNM No   Overview Addendum 06/08/2017  3:03 PM by Natale MilchSchuman, Christanna R, MD    [facility] Prenatal Labs  Dating  LMP= 13wk US Blood type: A/Positive/-- (01/16 1147)   Genetic Screen 1 Screen:    AFP:     Quad:     NIPS: Antibody:Negative (01/16 1147)  Anatomic US Complete Rubella: 5.64 (01/16 1147) Varicella: Immune  GTT Early: 119                Third trimester:  RPR: Non Reactive (01/16 1147)   Rhogam  not applicable HBsAg: Negative (01/16 1147)   TDaP vaccine                        Flu Shot: HIV: Non Reactive (01/16 1147)   Baby Food  Breast                              GBS:   Contraception  IUD vs Tubal Pap: 02/22/2017, NILM and HPV Neg  CBB  Given information   CS/VBAC  Not applicable   Support Person                  Preterm labor symptoms and general obstetric precautions including but not limited to vaginal bleeding, contractions, leaking of fluid and fetal movement were reviewed in detail with the patient. Please refer to After Visit Summary for other counseling recommendations.   Return in about 2 weeks (around 08/01/2017) for Routine Prenatal Appointment.  Thomasene MohairStephen Neco Kling, MD, Merlinda FrederickFACOG [facility], [facility] 07/18/2017 4:13 PM

## 2017-08-01 ENCOUNTER — Encounter: Payer: Medicaid Other | Admitting: Obstetrics & Gynecology

## 2017-08-02 ENCOUNTER — Ambulatory Visit (INDEPENDENT_AMBULATORY_CARE_PROVIDER_SITE_OTHER): Payer: Medicaid Other | Admitting: Obstetrics and Gynecology

## 2017-08-02 VITALS — BP 112/62 | Wt 259.0 lb

## 2017-08-02 DIAGNOSIS — O99210 Obesity complicating pregnancy, unspecified trimester: Secondary | ICD-10-CM | POA: Insufficient documentation

## 2017-08-02 DIAGNOSIS — Z3483 Encounter for supervision of other normal pregnancy, third trimester: Secondary | ICD-10-CM

## 2017-08-02 DIAGNOSIS — Z3A35 35 weeks gestation of pregnancy: Secondary | ICD-10-CM

## 2017-08-02 DIAGNOSIS — O99213 Obesity complicating pregnancy, third trimester: Secondary | ICD-10-CM

## 2017-08-02 DIAGNOSIS — Z3480 Encounter for supervision of other normal pregnancy, unspecified trimester: Secondary | ICD-10-CM

## 2017-08-02 NOTE — Progress Notes (Signed)
ROB

## 2017-08-02 NOTE — Progress Notes (Signed)
Routine Prenatal Care Visit  Subjective  Christina Bautista is a 32 y.o. [phone] at 185w0d being seen today for ongoing prenatal care.  She is currently monitored for the following issues for this low-risk pregnancy and has Supervision of other normal pregnancy, antepartum; History of pneumonia; and Maternal obesity, antepartum on their problem list.  ----------------------------------------------------------------------------------- Patient reports no complaints.   Contractions: Irregular. Vag. Bleeding: None.  Movement: Present. Denies leaking of fluid.  ----------------------------------------------------------------------------------- The following portions of the patient's history were reviewed and updated as appropriate: allergies, current medications, past family history, past medical history, past social history, past surgical history and problem list. Problem list updated.   Objective  Blood pressure 112/62, weight 259 lb (117.5 kg), last menstrual period 11/30/2016. Pregravid weight 235 lb (106.6 kg) Total Weight Gain 24 lb (10.9 kg)  Body mass index is 38.25 kg/m.  Urinalysis: Urine Protein: Negative Urine Glucose: Negative  Fetal Status: Fetal Heart Rate (bpm): 140 Fundal Height: 35 cm Movement: Present     General:  Alert, oriented and cooperative. Patient is in no acute distress.  Skin: Skin is warm and dry. No rash noted.   Cardiovascular: Normal heart rate noted  Respiratory: Normal respiratory effort, no problems with respiration noted  Abdomen: Soft, gravid, appropriate for gestational age. Pain/Pressure: Absent     Pelvic:  Cervical exam deferred        Extremities: Normal range of motion.     ental Status: Normal mood and affect. Normal behavior. Normal judgment and thought content.     Assessment   32 y.o. [id number] at 3785w0d by  09/06/2017, by Last Menstrual Period presenting for routine prenatal visit  Plan   sixth Problems (from 02/22/17 to present)    Problem Noted Resolved   Supervision of other normal pregnancy, antepartum 02/22/2017 by Oswaldo ConroySchmid, Jacelyn Y, CNM No   Overview Addendum 06/08/2017  3:03 PM by Natale MilchSchuman, Christanna R, MD    [facility] Prenatal Labs  Dating  LMP= 13wk US Blood type: A/Positive/-- (01/16 1147)   Genetic Screen 1 Screen:    AFP:     Quad:     NIPS: Antibody:Negative (01/16 1147)  Anatomic US Complete Rubella: 5.64 (01/16 1147) Varicella: Immune  GTT Early: 119                Third trimester:  RPR: Non Reactive (01/16 1147)   Rhogam  not applicable HBsAg: Negative (01/16 1147)   TDaP vaccine                        Flu Shot: HIV: Non Reactive (01/16 1147)   Baby Food  Breast                              GBS:   Contraception  IUD vs Tubal Pap: 02/22/2017, NILM and HPV Neg  CBB  Given information   CS/VBAC  Not applicable   Support Person                  Gestational age appropriate obstetric precautions including but not limited to vaginal bleeding, contractions, leaking of fluid and fetal movement were reviewed in detail with the patient.   Immunization History  Administered Date(s) Administered  . Tdap 07/18/2017   - GBS next visit  Return in about 1 week (around 08/09/2017) for ROB.  Vena AustriaAndreas Deija Buhrman, MD, Merlinda FrederickFACOG [facility], [facility] 08/02/2017, 4:58  PM

## 2017-08-09 ENCOUNTER — Ambulatory Visit (INDEPENDENT_AMBULATORY_CARE_PROVIDER_SITE_OTHER): Payer: Medicaid Other | Admitting: Obstetrics and Gynecology

## 2017-08-09 ENCOUNTER — Encounter: Payer: Self-pay | Admitting: Obstetrics and Gynecology

## 2017-08-09 VITALS — BP 108/62 | Wt 265.0 lb

## 2017-08-09 DIAGNOSIS — O99210 Obesity complicating pregnancy, unspecified trimester: Secondary | ICD-10-CM

## 2017-08-09 DIAGNOSIS — Z3A36 36 weeks gestation of pregnancy: Secondary | ICD-10-CM

## 2017-08-09 DIAGNOSIS — Z3480 Encounter for supervision of other normal pregnancy, unspecified trimester: Secondary | ICD-10-CM

## 2017-08-09 DIAGNOSIS — Z6834 Body mass index (BMI) 34.0-34.9, adult: Secondary | ICD-10-CM

## 2017-08-09 DIAGNOSIS — Z3483 Encounter for supervision of other normal pregnancy, third trimester: Secondary | ICD-10-CM

## 2017-08-09 NOTE — Progress Notes (Incomplete)
  Routine Prenatal Care Visit  Subjective  Christina Bautista is a 32 y.o. [phone] at 7849w0d being seen today for ongoing prenatal care.  She is currently monitored for the following issues for this {Blank single:19197::"high-risk","low-risk"} pregnancy and has Supervision of other normal pregnancy, antepartum; History of pneumonia; Maternal obesity, antepartum; and BMI 34.0-34.9,adult on their problem list.  ----------------------------------------------------------------------------------- Patient reports {sx:14538}.   Contractions: Irregular. Vag. Bleeding: None.  Movement: Present. Denies leaking of fluid.  ----------------------------------------------------------------------------------- The following portions of the patient's history were reviewed and updated as appropriate: allergies, current medications, past family history, past medical history, past social history, past surgical history and problem list. Problem list updated.   Objective  Blood pressure 108/62, weight 265 lb (120.2 kg), last menstrual period 11/30/2016. Pregravid weight 235 lb (106.6 kg) Total Weight Gain 30 lb (13.6 kg) Urinalysis: Urine Protein: Negative Urine Glucose: Negative  Fetal Status: Fetal Heart Rate (bpm): 135 Fundal Height: 36 cm Movement: Present  Presentation: Vertex  General:  Alert, oriented and cooperative. Patient is in no acute distress.  Skin: Skin is warm and dry. No rash noted.   Cardiovascular: Normal heart rate noted  Respiratory: Normal respiratory effort, no problems with respiration noted  Abdomen: Soft, gravid, appropriate for gestational age. Pain/Pressure: Absent     Pelvic:  {Blank single:19197::"Cervical exam performed","Cervical exam deferred"}        Extremities: Normal range of motion.     Mental Status: Normal mood and affect. Normal behavior. Normal judgment and thought content.   Assessment   32 y.o. [id number] at 6949w0d by  09/06/2017, by Last Menstrual Period presenting for  {Blank single:19197::"routine","work-in"} prenatal visit  Plan   sixth Problems (from 02/22/17 to present)    Problem Noted Resolved   Maternal obesity, antepartum 08/02/2017 by Vena AustriaStaebler, Andreas, MD No   Supervision of other normal pregnancy, antepartum 02/22/2017 by Oswaldo ConroySchmid, Jacelyn Y, CNM No   Overview Addendum 08/02/2017  4:57 PM by Vena AustriaStaebler, Andreas, MD    [facility]inic [facility] Prenatal Labs  Dating  LMP= 13wk US Blood type: A/Positive/-- (01/16 1147)   Genetic Screen 1 Screen:    AFP:     Quad:     NIPS: Antibody:Negative (01/16 1147)  Anatomic US Complete Rubella: 5.64 (01/16 1147) Varicella: Immune  GTT Early: 119                Third trimester: 102 RPR: Non Reactive (01/16 1147)   Rhogam  not applicable HBsAg: Negative (01/16 1147)   TDaP vaccine 07/18/17 HIV: Non Reactive (01/16 1147)   Baby Food  Breast                              GBS:   Contraception  IUD vs Tubal Pap: 02/22/2017, NILM and HPV Neg  CBB  Given information   CS/VBAC  Not applicable   Support Person                  {Blank single:19197::"Term","Preterm"} labor symptoms and general obstetric precautions including but not limited to vaginal bleeding, contractions, leaking of fluid and fetal movement were reviewed in detail with the patient. Please refer to After Visit Summary for other counseling recommendations.   Return in about 1 week (around 08/16/2017) for Routine Prenatal Appointment.  Thomasene MohairStephen Valia Wingard, MD, Merlinda FrederickFACOG [facility], [facility] 08/09/2017 4:43 PM

## 2017-08-09 NOTE — Progress Notes (Signed)
  Routine Prenatal Care Visit  Subjective  Christina Bautista is a 32 y.o. [phone] at 255w0d being seen today for ongoing prenatal care.  She is currently monitored for the following issues for this high-risk pregnancy and has Supervision of other normal pregnancy, antepartum; History of pneumonia; Maternal obesity, antepartum; and BMI 34.0-34.9,adult on their problem list.  ----------------------------------------------------------------------------------- Patient reports no complaints.   Contractions: Irregular. Vag. Bleeding: None.  Movement: Present. Denies leaking of fluid.  ----------------------------------------------------------------------------------- The following portions of the patient's history were reviewed and updated as appropriate: allergies, current medications, past family history, past medical history, past social history, past surgical history and problem list. Problem list updated.   Objective  Blood pressure 108/62, weight 265 lb (120.2 kg), last menstrual period 11/30/2016. Pregravid weight 235 lb (106.6 kg) Total Weight Gain 30 lb (13.6 kg) Urinalysis: Urine Protein: Negative Urine Glucose: Negative  Fetal Status: Fetal Heart Rate (bpm): 135 Fundal Height: 36 cm Movement: Present  Presentation: Vertex  General:  Alert, oriented and cooperative. Patient is in no acute distress.  Skin: Skin is warm and dry. No rash noted.   Cardiovascular: Normal heart rate noted  Respiratory: Normal respiratory effort, no problems with respiration noted  Abdomen: Soft, gravid, appropriate for gestational age. Pain/Pressure: Absent     Pelvic:  Cervical exam deferred        Extremities: Normal range of motion.     Mental Status: Normal mood and affect. Normal behavior. Normal judgment and thought content.   Assessment   32 y.o. [id number] at 2355w0d by  09/06/2017, by Last Menstrual Period presenting for routine prenatal visit  Plan   sixth Problems (from 02/22/17 to present)    Problem Noted Resolved   Maternal obesity, antepartum 08/02/2017 by Vena AustriaStaebler, Andreas, MD No   Supervision of other normal pregnancy, antepartum 02/22/2017 by Oswaldo ConroySchmid, Jacelyn Y, CNM No   Overview Addendum 08/02/2017  4:57 PM by Vena AustriaStaebler, Andreas, MD    [facility] Prenatal Labs  Dating  LMP= 13wk US Blood type: A/Positive/-- (01/16 1147)   Genetic Screen 1 Screen:    AFP:     Quad:     NIPS: Antibody:Negative (01/16 1147)  Anatomic US Complete Rubella: 5.64 (01/16 1147) Varicella: Immune  GTT Early: 119                Third trimester: 102 RPR: Non Reactive (01/16 1147)   Rhogam  not applicable HBsAg: Negative (01/16 1147)   TDaP vaccine 07/18/17 HIV: Non Reactive (01/16 1147)   Baby Food  Breast                              GBS:   Contraception  IUD vs Tubal Pap: 02/22/2017, NILM and HPV Neg  CBB  Given information   CS/VBAC  Not applicable   Support Person                  Preterm labor symptoms and general obstetric precautions including but not limited to vaginal bleeding, contractions, leaking of fluid and fetal movement were reviewed in detail with the patient. Please refer to After Visit Summary for other counseling recommendations.   GBS/Aptima today  Return in about 1 week (around 08/16/2017) for Routine Prenatal Appointment.  Thomasene MohairStephen Teigen Parslow, MD, Merlinda FrederickFACOG [facility], [facility] 08/09/2017 4:43 PM

## 2017-08-11 LAB — STREP GP B NAA: STREP GROUP B AG: NEGATIVE

## 2017-08-11 LAB — GC/CHLAMYDIA PROBE AMP
CHLAMYDIA, DNA PROBE: NEGATIVE
Neisseria gonorrhoeae by PCR: NEGATIVE

## 2017-08-17 ENCOUNTER — Ambulatory Visit (INDEPENDENT_AMBULATORY_CARE_PROVIDER_SITE_OTHER): Payer: Medicaid Other | Admitting: Obstetrics and Gynecology

## 2017-08-17 ENCOUNTER — Encounter: Payer: Self-pay | Admitting: Obstetrics and Gynecology

## 2017-08-17 VITALS — BP 112/64 | Wt 270.0 lb

## 2017-08-17 DIAGNOSIS — Z3480 Encounter for supervision of other normal pregnancy, unspecified trimester: Secondary | ICD-10-CM

## 2017-08-17 DIAGNOSIS — O99210 Obesity complicating pregnancy, unspecified trimester: Secondary | ICD-10-CM

## 2017-08-17 DIAGNOSIS — Z3A37 37 weeks gestation of pregnancy: Secondary | ICD-10-CM

## 2017-08-17 NOTE — Progress Notes (Signed)
ROB Hard ctx today

## 2017-08-17 NOTE — Progress Notes (Signed)
    Routine Prenatal Care Visit  Subjective  Christina Bautista is a 32 y.o. [phone] at 468w1d being seen today for ongoing prenatal care.  She is currently monitored for the following issues for this low-risk pregnancy and has Supervision of other normal pregnancy, antepartum; History of pneumonia; Maternal obesity, antepartum; and BMI 34.0-34.9,adult on their problem list.  ----------------------------------------------------------------------------------- Patient reports contractions which were strong earlier today. They have now resolved, increased vaginal discharge.   Contractions: Irregular. Vag. Bleeding: None.  Movement: Present. Denies leaking of fluid.  ----------------------------------------------------------------------------------- The following portions of the patient's history were reviewed and updated as appropriate: allergies, current medications, past family history, past medical history, past social history, past surgical history and problem list. Problem list updated.   Objective  Blood pressure 112/64, weight 270 lb (122.5 kg), last menstrual period 11/30/2016. Pregravid weight 235 lb (106.6 kg) Total Weight Gain 35 lb (15.9 kg) Urinalysis: Urine Protein: Trace Urine Glucose: Negative  Fetal Status: Fetal Heart Rate (bpm): 134 Fundal Height: 36 cm Movement: Present  Presentation: Vertex  General:  Alert, oriented and cooperative. Patient is in no acute distress.  Skin: Skin is warm and dry. No rash noted.   Cardiovascular: Normal heart rate noted  Respiratory: Normal respiratory effort, no problems with respiration noted  Abdomen: Soft, gravid, appropriate for gestational age. Pain/Pressure: Absent     Pelvic:  Cervical exam performed Dilation: 2 Effacement (%): 20 Station: -3  Extremities: Normal range of motion.     ental Status: Normal mood and affect. Normal behavior. Normal judgment and thought content.     Assessment   32 y.o. [id number] at 358w1d by  09/06/2017,  by Last Menstrual Period presenting for routine prenatal visit  Plan   sixth Problems (from 02/22/17 to present)    Problem Noted Resolved   Maternal obesity, antepartum 08/02/2017 by Vena AustriaStaebler, Andreas, MD No   Supervision of other normal pregnancy, antepartum 02/22/2017 by Oswaldo ConroySchmid, Jacelyn Y, CNM No   Overview Addendum 08/11/2017  8:11 AM by Conard NovakJackson, Stephen D, MD    [facility] Prenatal Labs  Dating  LMP= 13wk US Blood type: A/Positive/-- (01/16 1147)   Genetic Screen 1 Screen:    AFP:     Quad:     NIPS: Antibody:Negative (01/16 1147)  Anatomic US Complete Rubella: 5.64 (01/16 1147) Varicella: Immune  GTT Early: 119                Third trimester: 102 RPR: Non Reactive (01/16 1147)   Rhogam  not applicable HBsAg: Negative (01/16 1147)   TDaP vaccine 07/18/17 HIV: Non Reactive (01/16 1147)   Baby Food  Breast                              GBS: Negative 6/25  Contraception  IUD vs Tubal Pap: 02/22/2017, NILM and HPV Neg  CBB  Given information   CS/VBAC  Not applicable   Support Person                  Gestational age appropriate obstetric precautions including but not limited to vaginal bleeding, contractions, leaking of fluid and fetal movement were reviewed in detail with the patient.    Return in about 1 week (around 08/24/2017) for ROB.  Adelene Idlerhristanna Addie Cederberg MD [facility], [facility] 08/17/17 4:44 PM

## 2017-08-24 ENCOUNTER — Encounter: Payer: Medicaid Other | Admitting: Obstetrics and Gynecology

## 2017-08-31 ENCOUNTER — Ambulatory Visit (INDEPENDENT_AMBULATORY_CARE_PROVIDER_SITE_OTHER): Payer: Medicaid Other | Admitting: Obstetrics and Gynecology

## 2017-08-31 VITALS — BP 120/66 | Wt 267.0 lb

## 2017-08-31 DIAGNOSIS — Z3A39 39 weeks gestation of pregnancy: Secondary | ICD-10-CM

## 2017-08-31 DIAGNOSIS — Z6839 Body mass index (BMI) 39.0-39.9, adult: Secondary | ICD-10-CM

## 2017-08-31 DIAGNOSIS — O99210 Obesity complicating pregnancy, unspecified trimester: Secondary | ICD-10-CM

## 2017-08-31 DIAGNOSIS — O99213 Obesity complicating pregnancy, third trimester: Secondary | ICD-10-CM

## 2017-08-31 DIAGNOSIS — O09293 Supervision of pregnancy with other poor reproductive or obstetric history, third trimester: Secondary | ICD-10-CM

## 2017-08-31 DIAGNOSIS — Z3480 Encounter for supervision of other normal pregnancy, unspecified trimester: Secondary | ICD-10-CM

## 2017-08-31 DIAGNOSIS — O09299 Supervision of pregnancy with other poor reproductive or obstetric history, unspecified trimester: Secondary | ICD-10-CM | POA: Insufficient documentation

## 2017-08-31 NOTE — Progress Notes (Signed)
ROB Hip pain

## 2017-08-31 NOTE — Progress Notes (Signed)
Routine Prenatal Care Visit  Subjective  Christina Bautista is a 32 y.o. [id number] at [redacted]w[redacted]d being seen today for ongoing prenatal care.  She is currently monitored for the following issues for this low-risk pregnancy and has Supervision of other normal pregnancy, antepartum; History of pneumonia; Maternal obesity, antepartum; BMI 34.0-34.9,adult; and History of macrosomia in infant in prior pregnancy, currently pregnant on their problem list.  ----------------------------------------------------------------------------------- Patient reports no complaints.   Contractions: Irregular. Vag. Bleeding: None.  Movement: Present. Denies leaking of fluid.  ----------------------------------------------------------------------------------- The following portions of the patient's history were reviewed and updated as appropriate: allergies, current medications, past family history, past medical history, past social history, past surgical history and problem list. Problem list updated.   Objective  Blood pressure 120/66, weight 267 lb (121.1 kg), last menstrual period 11/30/2016. Pregravid weight 235 lb (106.6 kg) Total Weight Gain 32 lb (14.5 kg)  Body mass index is 39.43 kg/m.  Urinalysis: Urine Protein: Negative Urine Glucose: Negative  Fetal Status: Fetal Heart Rate (bpm): 145 Fundal Height: 40 cm Movement: Present  Presentation: Vertex  General:  Alert, oriented and cooperative. Patient is in no acute distress.  Skin: Skin is warm and dry. No rash noted.   Cardiovascular: Normal heart rate noted  Respiratory: Normal respiratory effort, no problems with respiration noted  Abdomen: Soft, gravid, appropriate for gestational age. Pain/Pressure: Absent     Pelvic:  Cervical exam performed Dilation: 3 Effacement (%): 50 Station: -3  Extremities: Normal range of motion.     ental Status: Normal mood and affect. Normal behavior. Normal judgment and thought content.     Assessment   32 y.o.  [id number] at 107w1d by  09/06/2017, by Last Menstrual Period presenting for routine prenatal visit  Plan   sixth Problems (from 02/22/17 to present)    Problem Noted Resolved   Maternal obesity, antepartum 08/02/2017 by Vena Austria, MD No   Supervision of other normal pregnancy, antepartum 02/22/2017 by Oswaldo Conroy, CNM No   Overview Addendum 08/11/2017  8:11 AM by Conard Novak, MD    [facility] Prenatal Labs  Dating  LMP= 13wk [place] Blood type: A/Positive/-- (01/16 1147)   Genetic Screen 1 Screen:    AFP:     Quad:     NIPS: Antibody:Negative (01/16 1147)  Anatomic [place] Complete Rubella: 5.64 (01/16 1147) Varicella: Immune  GTT Early: 119                Third trimester: 102 RPR: Non Reactive (01/16 1147)   Rhogam  not applicable HBsAg: Negative (01/16 1147)   TDaP vaccine 07/18/17 HIV: Non Reactive (01/16 1147)   Baby Food  Breast                              GBS: Negative 6/25  Contraception  IUD vs Tubal Pap: 02/22/2017, NILM and HPV Neg  CBB  Given information   CS/VBAC  Not applicable   Support Person                  Gestational age appropriate obstetric precautions including but not limited to vaginal bleeding, contractions, leaking of fluid and fetal movement were reviewed in detail with the patient.   - pelvis tested to 10lbs, growth scan ordered next visit - declines stripping of membranes  Return in about 2 weeks (around 09/14/2017) for ROB and growth scan.  Vena Austria, MD, [facility], [facility] 09/02/2017, 1:07 PM

## 2017-09-04 ENCOUNTER — Other Ambulatory Visit: Payer: Self-pay

## 2017-09-04 ENCOUNTER — Inpatient Hospital Stay: Payer: Medicaid Other | Admitting: Anesthesiology

## 2017-09-04 ENCOUNTER — Inpatient Hospital Stay
Admission: EM | Admit: 2017-09-04 | Discharge: 2017-09-05 | DRG: 807 | Disposition: A | Discharge status: Home / Self Care | Payer: Medicaid Other | Attending: Certified Nurse Midwife | Admitting: Certified Nurse Midwife

## 2017-09-04 DIAGNOSIS — E669 Obesity, unspecified: Secondary | ICD-10-CM | POA: Diagnosis present

## 2017-09-04 DIAGNOSIS — Z3480 Encounter for supervision of other normal pregnancy, unspecified trimester: Secondary | ICD-10-CM

## 2017-09-04 DIAGNOSIS — O99210 Obesity complicating pregnancy, unspecified trimester: Secondary | ICD-10-CM

## 2017-09-04 DIAGNOSIS — Z3A39 39 weeks gestation of pregnancy: Secondary | ICD-10-CM | POA: Diagnosis not present

## 2017-09-04 DIAGNOSIS — O09293 Supervision of pregnancy with other poor reproductive or obstetric history, third trimester: Secondary | ICD-10-CM | POA: Diagnosis not present

## 2017-09-04 DIAGNOSIS — O99214 Obesity complicating childbirth: Secondary | ICD-10-CM | POA: Diagnosis present

## 2017-09-04 DIAGNOSIS — I952 Hypotension due to drugs: Secondary | ICD-10-CM | POA: Diagnosis not present

## 2017-09-04 DIAGNOSIS — Z6839 Body mass index (BMI) 39.0-39.9, adult: Secondary | ICD-10-CM

## 2017-09-04 DIAGNOSIS — Z3483 Encounter for supervision of other normal pregnancy, third trimester: Secondary | ICD-10-CM | POA: Diagnosis present

## 2017-09-04 HISTORY — DX: Obesity, unspecified: E66.9

## 2017-09-04 HISTORY — DX: Pneumonia, unspecified organism: J18.9

## 2017-09-04 HISTORY — DX: Diseases of the respiratory system complicating pregnancy, unspecified trimester: O99.519

## 2017-09-04 LAB — CBC
HCT: 37.6 % (ref 35.0–47.0)
HEMOGLOBIN: 13.3 g/dL (ref 12.0–16.0)
MCH: 34.4 pg — AB (ref 26.0–34.0)
MCHC: 35.3 g/dL (ref 32.0–36.0)
MCV: 97.6 fL (ref 80.0–100.0)
PLATELETS: 147 10*3/uL — AB (ref 150–440)
RBC: 3.85 MIL/uL (ref 3.80–5.20)
RDW: 14.9 % — AB (ref 11.5–14.5)
WBC: 8.7 10*3/uL (ref 3.6–11.0)

## 2017-09-04 LAB — TYPE AND SCREEN
ABO/RH(D): A POS
ANTIBODY SCREEN: NEGATIVE

## 2017-09-04 MED ORDER — DIPHENHYDRAMINE HCL 50 MG/ML IJ SOLN: 12.5000 mg | INTRAMUSCULAR | Status: DC | PRN

## 2017-09-04 MED ORDER — EPHEDRINE 5 MG/ML INJ
10.0000 mg | INTRAVENOUS | Status: DC | PRN
  Administered 2017-09-04: 10 mg via INTRAVENOUS
  Filled 2017-09-04: qty 4
  Filled 2017-09-04: qty 2

## 2017-09-04 MED ORDER — PHENYLEPHRINE 40 MCG/ML (10ML) SYRINGE FOR IV PUSH (FOR BLOOD PRESSURE SUPPORT)
80.0000 ug | PREFILLED_SYRINGE | INTRAVENOUS | Status: DC | PRN
  Filled 2017-09-04: qty 5

## 2017-09-04 MED ORDER — SENNOSIDES-DOCUSATE SODIUM 8.6-50 MG PO TABS
2.0000 | ORAL_TABLET | ORAL | Status: DC
  Administered 2017-09-05: 2 via ORAL
  Filled 2017-09-04: qty 2

## 2017-09-04 MED ORDER — BENZOCAINE-MENTHOL 20-0.5 % EX AERO
1.0000 "application " | INHALATION_SPRAY | CUTANEOUS | Status: DC | PRN
  Administered 2017-09-04: 1 via TOPICAL
  Filled 2017-09-04: qty 56

## 2017-09-04 MED ORDER — COCONUT OIL OIL
1.0000 "application " | TOPICAL_OIL | Status: DC | PRN
  Administered 2017-09-05: 1 via TOPICAL
  Filled 2017-09-04: qty 120

## 2017-09-04 MED ORDER — AMMONIA AROMATIC IN INHA: 0.3000 mL | Freq: Once | RESPIRATORY_TRACT | Status: DC | PRN

## 2017-09-04 MED ORDER — TERBUTALINE SULFATE 1 MG/ML IJ SOLN: 0.2500 mg | Freq: Once | INTRAMUSCULAR | Status: DC | PRN

## 2017-09-04 MED ORDER — OXYTOCIN 40 UNITS IN LACTATED RINGERS INFUSION - SIMPLE MED
1.0000 m[IU]/min | INTRAVENOUS | Status: DC
  Administered 2017-09-04: 1 m[IU]/min via INTRAVENOUS

## 2017-09-04 MED ORDER — SODIUM CHLORIDE 0.9 % IV SOLN
INTRAVENOUS | Status: DC | PRN
  Administered 2017-09-04 (×2): 5 mL via EPIDURAL

## 2017-09-04 MED ORDER — LACTATED RINGERS IV SOLN
INTRAVENOUS | Status: DC
  Administered 2017-09-04: 05:00:00 via INTRAVENOUS

## 2017-09-04 MED ORDER — LACTATED RINGERS IV SOLN: 500.0000 mL | INTRAVENOUS | Status: DC | PRN

## 2017-09-04 MED ORDER — FENTANYL 2.5 MCG/ML W/ROPIVACAINE 0.15% IN NS 100 ML EPIDURAL (ARMC)
EPIDURAL | Status: AC
  Filled 2017-09-04: qty 100

## 2017-09-04 MED ORDER — ONDANSETRON HCL 4 MG/2ML IJ SOLN
4.0000 mg | Freq: Four times a day (QID) | INTRAMUSCULAR | Status: DC | PRN
  Filled 2017-09-04: qty 2

## 2017-09-04 MED ORDER — LIDOCAINE HCL (PF) 1 % IJ SOLN: 30.0000 mL | INTRAMUSCULAR | Status: DC | PRN

## 2017-09-04 MED ORDER — LIDOCAINE HCL (PF) 1 % IJ SOLN
INTRAMUSCULAR | Status: DC | PRN
  Administered 2017-09-04: 3 mL via SUBCUTANEOUS

## 2017-09-04 MED ORDER — FENTANYL 2.5 MCG/ML W/ROPIVACAINE 0.15% IN NS 100 ML EPIDURAL (ARMC)
EPIDURAL | Status: DC | PRN
  Administered 2017-09-04: 12 mL/h via EPIDURAL

## 2017-09-04 MED ORDER — WITCH HAZEL-GLYCERIN EX PADS: 1.0000 "application " | MEDICATED_PAD | CUTANEOUS | Status: DC | PRN

## 2017-09-04 MED ORDER — SIMETHICONE 80 MG PO CHEW: 80.0000 mg | CHEWABLE_TABLET | ORAL | Status: DC | PRN

## 2017-09-04 MED ORDER — OXYTOCIN 40 UNITS IN LACTATED RINGERS INFUSION - SIMPLE MED
2.5000 [IU]/h | INTRAVENOUS | Status: DC
  Administered 2017-09-04: 2.5 [IU]/h via INTRAVENOUS
  Filled 2017-09-04: qty 1000

## 2017-09-04 MED ORDER — LIDOCAINE-EPINEPHRINE (PF) 1.5 %-1:200000 IJ SOLN
INTRAMUSCULAR | Status: DC | PRN
  Administered 2017-09-04: 3 mL via EPIDURAL

## 2017-09-04 MED ORDER — ONDANSETRON HCL 4 MG/2ML IJ SOLN: 4.0000 mg | INTRAMUSCULAR | Status: DC | PRN

## 2017-09-04 MED ORDER — PRENATAL MULTIVITAMIN CH
1.0000 | ORAL_TABLET | Freq: Every day | ORAL | Status: DC
  Administered 2017-09-05: 1 via ORAL
  Filled 2017-09-04: qty 1

## 2017-09-04 MED ORDER — IBUPROFEN 600 MG PO TABS
600.0000 mg | ORAL_TABLET | Freq: Four times a day (QID) | ORAL | Status: DC
  Administered 2017-09-04 – 2017-09-05 (×2): 600 mg via ORAL
  Filled 2017-09-04 (×3): qty 1

## 2017-09-04 MED ORDER — MISOPROSTOL 200 MCG PO TABS
800.0000 ug | ORAL_TABLET | Freq: Once | ORAL | Status: DC | PRN
  Filled 2017-09-04: qty 4

## 2017-09-04 MED ORDER — BUTORPHANOL TARTRATE 1 MG/ML IJ SOLN
1.0000 mg | INTRAMUSCULAR | Status: DC | PRN
  Administered 2017-09-04: 1 mg via INTRAVENOUS
  Filled 2017-09-04 (×2): qty 1

## 2017-09-04 MED ORDER — FENTANYL 2.5 MCG/ML W/ROPIVACAINE 0.15% IN NS 100 ML EPIDURAL (ARMC): 12.0000 mL/h | EPIDURAL | Status: DC

## 2017-09-04 MED ORDER — EPHEDRINE 5 MG/ML INJ
10.0000 mg | INTRAVENOUS | Status: AC | PRN
  Administered 2017-09-04 (×2): 10 mg via INTRAVENOUS
  Filled 2017-09-04: qty 4

## 2017-09-04 MED ORDER — OXYTOCIN BOLUS FROM INFUSION
500.0000 mL | Freq: Once | INTRAVENOUS | Status: AC
  Administered 2017-09-04: 500 mL via INTRAVENOUS

## 2017-09-04 MED ORDER — DIBUCAINE 1 % RE OINT: 1.0000 "application " | TOPICAL_OINTMENT | RECTAL | Status: DC | PRN

## 2017-09-04 MED ORDER — LACTATED RINGERS IV SOLN: 500.0000 mL | Freq: Once | INTRAVENOUS | Status: DC

## 2017-09-04 MED ORDER — ONDANSETRON HCL 4 MG PO TABS: 4.0000 mg | ORAL_TABLET | ORAL | Status: DC | PRN

## 2017-09-04 NOTE — H&P (Addendum)
OB History & Physical   History of Present Illness:  Chief Complaint:  Contractions since 0300, now every 4-5 min apart. HPI:  Christina Bautista is a 32 y.o. [phone] female with EDC=09/06/2017 at 3140w5d dated by LMP and confirmed with a 13wk ultrasound.  Her pregnancy has been complicated by obesity and history of a macrosomic infant (10#).  She presents to L&D for evaluation of labor. Good FM. No leakage of fluid or vaginal bleeding   [facility]   [facility] Prenatal Labs  Dating  LMP= 13wk US Blood type: A/Positive/-- (01/16 1147)   Genetic Screen 1 Screen:    AFP:     Quad:     NIPS: Antibody:Negative (01/16 1147)  Anatomic US Complete Rubella: 5.64 (01/16 1147) Varicella: Immune  GTT Early: 119                Third trimester: 102 RPR: Non Reactive (01/16 1147)   Rhogam  not applicable HBsAg: Negative (01/16 1147)   TDaP vaccine 07/18/17 HIV: Non Reactive (01/16 1147)   Baby Food  Breast                              GBS: Negative 6/25  Contraception  Paraguard Pap: 02/22/2017, NILM and HPV Neg  CBB  Given information   CS/VBAC  Not applicable   Support Person           OB History  Gravida Para Term Preterm AB Living  6 4 4   2 4   SAB TAB Ectopic Multiple Live Births  2     0 4    # Outcome Date GA Lbr Len/2nd Weight Sex Delivery Anes PTL Lv  6 Term 09/04/17 4240w5d 10:18 / 00:07 3.884 kg (8 lb 9 oz) F Vag-Spont EPI  LIV  5 SAB 08/31/16 6626w0d    SAB     4 Term 09/27/13 3526w0d  2.807 kg (6 lb 3 oz) F Vag-Spont   LIV  3 Term 03/11/12 4083w0d  4.536 kg (10 lb) M Vag-Spont   LIV  2 SAB 2011     SAB     1 Term 07/02/03 2473w0d  4.082 kg (9 lb) F Vag-Spont   LIV    Maternal Medical History:   Past Medical History:  Diagnosis Date  . Obesity (BMI 35.0-39.9 without comorbidity)   . Pneumonia affecting pregnancy 2013/2014    Past Surgical History:  Procedure Laterality Date  . DILATION AND EVACUATION N/A 08/31/2016    Procedure: DILATATION AND EVACUATION;  Surgeon: Vena AustriaStaebler, Andreas, MD;  Location: [facility] ORS;  Service: Gynecology;  Laterality: N/A;    Allergies  Allergen Reactions  . Codeine Nausea Only and Other (See Comments)    Dizziness, lightheadedness    Prior to Admission medications   Medication Sig Start Date End Date Taking? Authorizing Provider  ferrous sulfate (FERROUSUL) 325 (65 FE) MG tablet Take 1 tablet (325 mg total) by mouth 2 (two) times daily. 08/31/16  Yes Vena AustriaStaebler, Andreas, MD  Prenatal Multivit-Min-Fe-FA (PRENATAL 1 + IRON PO) Take 1 tablet by mouth daily.   Yes [provider]      Social History: She  reports that she has never smoked. She has never used smokeless tobacco. She reports that she does not drink alcohol or use drugs.  Family History: family history is not on file.   Review of Systems: Negative x  10 systems reviewed except as noted in the HPI.      Physical Exam:  Vital Signs: BP 115/74 (BP Location: Left Arm)   Pulse 82   Temp 97.8 F (36.6 C) (Oral)   Resp 20   Ht 5\' 9"  (1.753 m)   Wt 121.1 kg (267 lb)   LMP 11/30/2016   SpO2 99%   BMI 39.43 kg/m  General: gravid Black female, breathing through contractions HEENT: normocephalic, atraumatic Heart: regular rate & rhythm.  No murmurs/rubs/gallops Lungs: clear to auscultation bilaterally Abdomen: soft, gravid, non-tender;  EFW: 9# Pelvic:   External: Normal external female genitalia  Cervix: Dilation: 5 / Effacement (%): 90 / Station: -2 / BBOW  Extremities: non-tender, symmetric, trace edema bilaterally.  Neurologic: Alert & oriented x 3.   Baseline FHR: 135-140 baseline  With moderate variability Contractions: every 2-6 min apart, some couplets/ triplets   Bedside Ultrasound:  Cephalic/ LOT/ fundal placenta Assessment:  Christina Bautista is a 32 y.o. [id number] female at [redacted]w[redacted]d in early/active labor FWB: Cat 1 tracing Plan:  1. Admit to Labor & Delivery -anticipate vaginal  delivery-pelvis proven to 10#  2. CBC, T&S, Clrs, IVF 3. GBS negative.   4. Consents obtained. 5. Desires epidural. Stadol if needed until then 6. A POS/ RI/ VI 7. TDAP 07/18/2017 8. Contraception: Cloretta Ned  09/04/2017 6:03 AM

## 2017-09-04 NOTE — OB Triage Note (Signed)
Pt presents c/o contractions that started around 3:00 am. She states that ctx have been 4-5 minutes apart. Pt denies any LOF or vaginal bleeding. Reports positive fetal movement. Vitals WNL. Will continue to monitor.

## 2017-09-04 NOTE — Anesthesia Preprocedure Evaluation (Signed)
Anesthesia Evaluation  Patient identified by MRN, date of birth, ID band Patient awake    Reviewed: Allergy & Precautions, H&P , NPO status , Patient's Chart, lab work & pertinent test results, reviewed documented beta blocker date and time   History of Anesthesia Complications Negative for: history of anesthetic complications  Airway Mallampati: II  TM Distance: >3 FB Neck ROM: full    Dental no notable dental hx.    Pulmonary neg pulmonary ROS,    Pulmonary exam normal        Cardiovascular Exercise Tolerance: Good negative cardio ROS Normal cardiovascular exam     Neuro/Psych negative neurological ROS  negative psych ROS   GI/Hepatic Neg liver ROS, GERD  ,  Endo/Other  negative endocrine ROS  Renal/GU negative Renal ROS  negative genitourinary   Musculoskeletal   Abdominal   Peds  Hematology negative hematology ROS (+)   Anesthesia Other Findings Past Medical History: No date: Obesity (BMI 35.0-39.9 without comorbidity) 2013/2014: Pneumonia affecting pregnancy   Reproductive/Obstetrics (+) Pregnancy                             Anesthesia Physical Anesthesia Plan  ASA: II  Anesthesia Plan: Epidural   Post-op Pain Management:    Induction:   PONV Risk Score and Plan:   Airway Management Planned:   Additional Equipment:   Intra-op Plan:   Post-operative Plan:   Informed Consent: I have reviewed the patients History and Physical, chart, labs and discussed the procedure including the risks, benefits and alternatives for the proposed anesthesia with the patient or authorized representative who has indicated his/her understanding and acceptance.   Dental Advisory Given  Plan Discussed with: Anesthesiologist, CRNA and Surgeon  Anesthesia Plan Comments:         Anesthesia Quick Evaluation

## 2017-09-04 NOTE — Anesthesia Procedure Notes (Signed)
Epidural Patient location during procedure: OB Start time: 09/04/2017 6:36 AM End time: 09/04/2017 6:50 AM  Staffing Anesthesiologist: Lenard SimmerKarenz, Ledarrius Beauchaine, MD Performed: anesthesiologist   Preanesthetic Checklist Completed: patient identified, site marked, surgical consent, pre-op evaluation, timeout performed, IV checked, risks and benefits discussed and monitors and equipment checked  Epidural Patient position: sitting Prep: ChloraPrep Patient monitoring: heart rate, continuous pulse ox and blood pressure Approach: midline Location: L3-L4 Injection technique: LOR saline  Needle:  Needle type: Tuohy  Needle gauge: 17 G Needle length: 9 cm and 9 Needle insertion depth: 7 cm Catheter type: closed end flexible Catheter size: 19 Gauge Catheter at skin depth: 12 cm Test dose: negative and 1.5% lidocaine with Epi 1:200 K  Assessment Sensory level: T10 Events: blood not aspirated, injection not painful, no injection resistance, negative IV test and no paresthesia  Additional Notes 1st attempt Pt. Evaluated and documentation done after procedure finished. Patient identified. Risks/Benefits/Options discussed with patient including but not limited to bleeding, infection, nerve damage, paralysis, failed block, incomplete pain control, headache, blood pressure changes, nausea, vomiting, reactions to medication both or allergic, itching and postpartum back pain. Confirmed with bedside nurse the patient's most recent platelet count. Confirmed with patient that they are not currently taking any anticoagulation, have any bleeding history or any family history of bleeding disorders. Patient expressed understanding and wished to proceed. All questions were answered. Sterile technique was used throughout the entire procedure. Please see nursing notes for vital signs. Test dose was given through epidural catheter and negative prior to continuing to dose epidural or start infusion. Warning signs of high  block given to the patient including shortness of breath, tingling/numbness in hands, complete motor block, or any concerning symptoms with instructions to call for help. Patient was given instructions on fall risk and not to get out of bed. All questions and concerns addressed with instructions to call with any issues or inadequate analgesia.   Patient tolerated the insertion well without immediate complications.Reason for block:procedure for pain

## 2017-09-04 NOTE — Discharge Summary (Signed)
Physician Obstetric Discharge Summary  Patient ID: Christina Bautista MRN: [id number] DOB/AGE: 09-12-1985 32 y.o.   Date of Admission: 09/04/2017 Date of Delivery: 09/04/2017 Date of Discharge: 09/05/2017  Admitting Diagnosis: Onset of Labor at 2037w5d  Secondary Diagnosis: none  Mode of Delivery: normal spontaneous vaginal delivery 09/04/2017      Discharge Diagnosis: Term intrauterine pregnancy delivered, Hypotensive episode after epidural, variable and early decelerations, Inadequate labor   Intrapartum Procedures: Atificial rupture of membranes, epidural, pitocin augmentation and placement of intrauterine catheter   Post partum procedures: none  Complications: none   Brief Hospital Course  Christina Bautista is a [id number] who had a SVD after a Pitocin augmented labor on 09/04/2017;  for further details of this delivery, please refer to the delivery note.  Patient had an uncomplicated postpartum course.  By time of discharge on PPD#1, her pain was controlled on oral pain medications; she had appropriate lochia and was ambulating, voiding without difficulty and tolerating regular diet.  She was deemed stable for discharge to home.    Labs: CBC Latest Ref Rng & Units 09/05/2017 09/04/2017 06/15/2017  WBC 3.6 - 11.0 K/uL 11.0 8.7 6.7  Hemoglobin 12.0 - 16.0 g/dL 11.8(L) 13.3 11.8  Hematocrit 35.0 - 47.0 % 34.3(L) 37.6 35.4  Platelets 150 - 440 K/uL 131(L) 147(L) 181   A POS/ RI/ VI  Physical exam:  Blood pressure (!) 122/53, pulse 60, temperature 97.6 F (36.4 C), temperature source Oral, resp. rate 20, height 5\' 9"  (1.753 m), weight 121.1 kg (267 lb), last menstrual period 11/30/2016, SpO2 97 %, currently breastfeeding. General: alert and no distress Lochia: appropriate Abdomen: soft, NT Uterine Fundus: firm Incision: NA Extremities: No evidence of DVT seen on physical exam. No lower extremity edema.  Discharge Instructions: Per After Visit Summary. Activity: Advance as tolerated.  Pelvic rest for 6 weeks.  Also refer to Discharge Instructions Diet: Regular Medications: Allergies as of 09/05/2017      Reactions   Codeine Nausea Only, Other (See Comments)   Dizziness, lightheadedness      Medication List    STOP taking these medications   ferrous sulfate 325 (65 FE) MG tablet Commonly known as:  FERROUSUL     TAKE these medications   HYDROcodone-acetaminophen 5-325 MG tablet Commonly known as:  NORCO/VICODIN Take 1 tablet by mouth every 6 (six) hours as needed for up to 3 days for moderate pain or severe pain.   PRENATAL 1 + IRON PO Take 1 tablet by mouth daily.      Outpatient follow up:  Follow-up Information    Farrel ConnersGutierrez, Colleen, CNM. Schedule an appointment as soon as possible for a visit in 6 week(s).   Specialty:  Certified Nurse Midwife Why:  Please call to schedule your 6 week postpartum follow up appointment with Farrel Connersolleen Gutierrez for a 6 week postpartum check and IUD insertion Contact information: [address] [place] [id number] [phone]          Postpartum contraception: IUD  Discharged Condition: good  Discharged to: home   Newborn Data: Reign 8#9oz Disposition:home with mother  Apgars: APGAR (1 MIN): 9   APGAR (5 MINS): 9   APGAR (10 MINS):    Baby Feeding: Breast  Tresea MallJane Cerita Rabelo, CNM 09/05/2017 4:56 PM

## 2017-09-04 NOTE — Plan of Care (Signed)
Pt. Oriented to room 345 and to Database administratornfant Safety and Security as well as Surveyor, miningall Precautions for both Mom and Edison InternationalBaby. Mom v/o. Fundus is firm at U/E and scant Lochia flow. Cheerful affect and denies c/o.

## 2017-09-04 NOTE — Lactation Note (Signed)
This note was copied from a baby's chart. Lactation Consultation Note  Patient Name: Christina Bautista Today's Date: 09/04/2017  Christina Bautista to birthplace to help experienced mom with first breast feeding.  Mom has large nipple and is exhausted.  Demonstrated how to easily hand express colostrum.  Christina Bautista latched with minimal assistance.  Mom kept dozing off so LC had to hold baby at breast.  Plus mom had not much feeling in left arm or breast.  Strong rhythmic sucking after attained deep latch at breast.  Once we moved her to right breast, mom could feel strong tugs at the breast.  She breast fed for 10 more minutes on right breast before Christina Bautista started falling asleep like mom.  Reviewed supply and demand, normal course of lactation and routine newborn feeding patterns.  Encouraged mom to call with any questions, concerns or assistance.    Maternal Data    Feeding Feeding Type: Formula Nipple Type: Slow - flow Length of feed: 10 min  LATCH Score                   Interventions    Lactation Tools Discussed/Used     Consult Status      Christina Bautista, Christina Bautista 09/04/2017, 7:51 PM

## 2017-09-04 NOTE — Progress Notes (Signed)
L&D progress Note   S: Comfortable after epidural. Feeling some pressure  O: BP (!) 101/53   Pulse 81   Temp 97.8 F (36.6 C) (Oral)   Resp 20   Ht 5\' 9"  (1.753 m)   Wt 121.1 kg (267 lb)   LMP 11/30/2016   SpO2 96%   BMI 39.43 kg/m   Had an episode of hypotension following the epidural, causing lightheadedness and nausea and vomiting and FHR decelerations. Episode resolved with ephedrine, O2, position changes and IV bolus. General; appears comfortable FHR: 130s with accelerations, moderate variability, and occasional early deceleration Contractions: every 2-3 min apart Cervix: 6-7 cm/60%/-1 to -2 with contractions. No change since AROM at 9:30 for thin meconium stained amniotic fluid  A: No progress since AROM FWB: reassuring  P: IUPC inserted: mvus<200 (135 mvus) Will start Pitocin augmentation  Christina Bautista, CNM

## 2017-09-05 LAB — CBC
HCT: 34.3 % — ABNORMAL LOW (ref 35.0–47.0)
HEMOGLOBIN: 11.8 g/dL — AB (ref 12.0–16.0)
MCH: 34 pg (ref 26.0–34.0)
MCHC: 34.3 g/dL (ref 32.0–36.0)
MCV: 99.1 fL (ref 80.0–100.0)
PLATELETS: 131 10*3/uL — AB (ref 150–440)
RBC: 3.47 MIL/uL — ABNORMAL LOW (ref 3.80–5.20)
RDW: 14.7 % — AB (ref 11.5–14.5)
WBC: 11 10*3/uL (ref 3.6–11.0)

## 2017-09-05 MED ORDER — HYDROCODONE-ACETAMINOPHEN 5-325 MG PO TABS: 1.0000 | ORAL_TABLET | Freq: Four times a day (QID) | ORAL | 0 refills | Status: AC | PRN

## 2017-09-05 MED ORDER — HYDROCODONE-ACETAMINOPHEN 5-325 MG PO TABS
1.0000 | ORAL_TABLET | ORAL | Status: DC | PRN
  Administered 2017-09-05: 1 via ORAL
  Filled 2017-09-05: qty 1

## 2017-09-05 NOTE — Progress Notes (Signed)
Post Partum Day 1 Subjective: Doing well, has strong uterine cramping with breastfeeding and is requesting stronger pain medicine than ibuprofen/tylenol.  Tolerating regular diet, pain with PO meds, voiding and ambulating without difficulty. She would like to go home today if baby is discharged.  No CP SOB F/C N/V or leg pain No HA, change of vision, RUQ/epigastric pain  Objective: BP 112/66 (BP Location: Right Arm)   Pulse 77   Temp 97.6 F (36.4 C) (Oral)   Resp 18   Ht 5\' 9"  (1.753 m)   Wt 267 lb (121.1 kg)   LMP 11/30/2016   SpO2 100%   Breastfeeding  BMI 39.43 kg/m    Physical Exam:  General: NAD CV: RRR Pulm: nl effort, CTABL Lochia: moderate Uterine Fundus: fundus firm and below umbilicus DVT Evaluation: no cords, ttp LEs   Recent Labs    09/04/17 0528 09/05/17 0507  HGB 13.3 11.8*  HCT 37.6 34.3*  WBC 8.7 11.0  PLT 147* 131*    Assessment/Plan: 32 y.o. [id number] postpartum day # 1  1. Continue routine postpartum care 2. A positive, Rubella Immune, Varicella Immune 3. TDAP UTD 4. Breastfeeding/Contraception: IUD 5. Disposition: possible discharge to home pending newborn discharge   Christina Bautista, CNM

## 2017-09-05 NOTE — Progress Notes (Signed)
Discharge order received from doctor. Reviewed discharge instructions and prescriptions with patient and answered all questions. Follow up appointment instructions given. Patient verbalized understanding. ID bands checked. Patient discharged home with infant via wheelchair by nursing/auxillary.    Taariq Leitz Garner, RN  

## 2017-09-05 NOTE — Anesthesia Postprocedure Evaluation (Signed)
Anesthesia Post Note  Patient: Christina Bautista  Procedure(s) Performed: AN AD HOC LABOR EPIDURAL  Patient location during evaluation: Mother Baby Anesthesia Type: Epidural Level of consciousness: awake, awake and alert and oriented Pain management: pain level controlled Vital Signs Assessment: post-procedure vital signs reviewed and stable Respiratory status: spontaneous breathing and nonlabored ventilation Cardiovascular status: blood pressure returned to baseline and stable Postop Assessment: no headache Anesthetic complications: no     Last Vitals:  Vitals:   09/04/17 2327 09/05/17 0402  BP: 124/79 116/72  Pulse: 88 75  Resp:  20  Temp: 36.7 C 36.7 C  SpO2: 99%     Last Pain:  Vitals:   09/05/17 0520  TempSrc:   PainSc: 7                  Vernie MurdersPope,  Iridian Reader G

## 2017-09-05 NOTE — Discharge Instructions (Signed)
Please call your doctor or return to the ER if you experience any chest pains, shortness of breath, dizziness, visual changes, fever greater than 101, any heavy bleeding (saturating more than 1 pad per hour), large clots, or foul smelling discharge, any worsening abdominal pain and cramping that is not controlled by pain medication, or any signs of postpartum depression. No tampons, enemas, douches, or sexual intercourse for 6 weeks. Also avoid tub baths, hot tubs, or swimming for 6 weeks.       Vaginal Delivery, Care After Refer to this sheet in the next few weeks. These discharge instructions provide you with information on caring for yourself after delivery. Your caregiver may also give you specific instructions. Your treatment has been planned according to the most current medical practices available, but problems sometimes occur. Call your caregiver if you have any problems or questions after you go home. HOME CARE INSTRUCTIONS 1. Take over-the-counter or prescription medicines only as directed by your caregiver or pharmacist. 2. Do not drink alcohol, especially if you are breastfeeding or taking medicine to relieve pain. 3. Do not smoke tobacco. 4. Continue to use good perineal care. Good perineal care includes: 1. Wiping your perineum from back to front 2. Keeping your perineum clean. 3. You can do sitz baths twice a day, to help keep this area clean 5. Do not use tampons, douche or have sex for 6 weeks 6. Shower only and avoid sitting in submerged water, aside from sitz baths 7. Wear a well-fitting bra that provides breast support. 8. Eat healthy foods. 9. Drink enough fluids to keep your urine clear or pale yellow. 10. Eat high-fiber foods such as whole grain cereals and breads, brown rice, beans, and fresh fruits and vegetables every day. These foods may help prevent or relieve constipation. 11. Avoid constipation with high fiber foods or medications, such as miralax or  metamucil 12. Follow your caregiver's recommendations regarding resumption of activities such as climbing stairs, driving, lifting, exercising, or traveling. 13. Talk to your caregiver about resuming sexual activities. Resumption of sexual activities after 6 weeks is dependent upon your risk of infection, your rate of healing, and your comfort and desire to resume sexual activity. 14. Try to have someone help you with your household activities and your newborn for at least a few days after you leave the hospital. 15. Rest as much as possible. Try to rest or take a nap when your newborn is sleeping. 16. Increase your activities gradually. 17. Keep all of your scheduled postpartum appointments. It is very important to keep your scheduled follow-up appointments. At these appointments, your caregiver will be checking to make sure that you are healing physically and emotionally. SEEK MEDICAL CARE IF:   You are passing large clots from your vagina. Save any clots to show your caregiver.  You have a foul smelling discharge from your vagina.  You have trouble urinating.  You are urinating frequently.  You have pain when you urinate.  You have a change in your bowel movements.  You have increasing redness, pain, or swelling near your vaginal incision (episiotomy) or vaginal tear.  You have pus draining from your episiotomy or vaginal tear.  Your episiotomy or vaginal tear is separating.  You have painful, hard, or reddened breasts.  You have a severe headache.  You have blurred vision or see spots.  You feel sad or depressed.  You have thoughts of hurting yourself or your newborn.  You have questions about your care, the care  your newborn, or medicines. °· You are dizzy or light-headed. °· You have a rash. °· You have nausea or vomiting. °· You were breastfeeding and have not had a menstrual period within 12 weeks after you stopped breastfeeding. °· You are not breastfeeding and have  not had a menstrual period by the 12th week after delivery. °· You have a fever of 100.5 or more °SEEK IMMEDIATE MEDICAL CARE IF:  °· You have persistent pain. °· You have chest pain. °· You have shortness of breath. °· You faint. °· You have leg pain. °· You have stomach pain. °· Your vaginal bleeding saturates two or more sanitary pads in 1 hour. °MAKE SURE YOU:  °· Understand these instructions. °· Will watch your condition. °· Will get help right away if you are not doing well or get worse. °Document Released: 01/30/2000 Document Revised: 06/18/2013 Document Reviewed: 09/29/2011 °ExitCare® Patient Information ©2015 [employer]. This information is not intended to replace advice given to you by your health care provider. Make sure you discuss any questions you have with your health care provider. ° °Sitz Bath °A sitz bath is a warm water bath taken in the sitting position. The water covers only the hips and butt (buttocks). We recommend using one that fits in the toilet, to help with ease of use and cleanliness. It may be used for either healing or cleaning purposes. Sitz baths are also used to relieve pain, itching, or muscle tightening (spasms). The water may contain medicine. Moist heat will help you heal and relax.  °HOME CARE  °Take 3 to 4 sitz baths a day. °18. Fill the bathtub half-full with warm water. °19. Sit in the water and open the drain a little. °20. Turn on the warm water to keep the tub half-full. Keep the water running constantly. °21. Soak in the water for 15 to 20 minutes. °22. After the sitz bath, pat the affected area dry. °GET HELP RIGHT AWAY IF: °You get worse instead of better. Stop the sitz baths if you get worse. °MAKE SURE YOU: °· Understand these instructions. °· Will watch your condition. °· Will get help right away if you are not doing well or get worse. °Document Released: 03/11/2004 Document Revised: 10/27/2011 Document Reviewed: 06/01/2010 °ExitCare® Patient Information ©2015  [employer]. This information is not intended to replace advice given to you by your health care provider. Make sure you discuss any questions you have with your health care provider. ° ° °

## 2017-09-05 NOTE — Lactation Note (Signed)
This note was copied from a baby's chart. Lactation Consultation Note  Patient Name: Girl Elgie Collardllyson Fundora [id number]'s Date: 09/05/2017 Reason for consult: Follow-up assessment   Maternal Data Formula Feeding for Exclusion: No Does the patient have breastfeeding experience prior to this delivery?: Yes  Feeding Feeding Type: Breast Fed Length of feed: (did not observe breastfeeding) Mom states baby latching and nursing well with swallows heard, she breastfed her last child for 2 yrs LATCH Score   I did not observe feeding                Interventions    Lactation Tools Discussed/Used     Consult Status Consult Status: PRN    Dyann KiefMarsha D Ashante Yellin 09/05/2017, 6:02 PM

## 2017-09-06 ENCOUNTER — Other Ambulatory Visit: Payer: Self-pay

## 2017-09-06 ENCOUNTER — Emergency Department: Payer: Medicaid Other

## 2017-09-06 ENCOUNTER — Encounter: Payer: Self-pay | Admitting: Emergency Medicine

## 2017-09-06 ENCOUNTER — Emergency Department
Admission: EM | Admit: 2017-09-06 | Discharge: 2017-09-06 | Disposition: A | Discharge status: Home / Self Care | Payer: Medicaid Other | Attending: Emergency Medicine | Admitting: Emergency Medicine

## 2017-09-06 DIAGNOSIS — N939 Abnormal uterine and vaginal bleeding, unspecified: Secondary | ICD-10-CM | POA: Insufficient documentation

## 2017-09-06 DIAGNOSIS — R102 Pelvic and perineal pain: Secondary | ICD-10-CM | POA: Diagnosis present

## 2017-09-06 LAB — CBC WITH DIFFERENTIAL/PLATELET
BASOS ABS: 0 10*3/uL (ref 0–0.1)
Basophils Relative: 0 %
EOS ABS: 0.1 10*3/uL (ref 0–0.7)
EOS PCT: 1 %
HCT: 34.6 % — ABNORMAL LOW (ref 35.0–47.0)
Hemoglobin: 11.8 g/dL — ABNORMAL LOW (ref 12.0–16.0)
LYMPHS ABS: 1.4 10*3/uL (ref 1.0–3.6)
LYMPHS PCT: 14 %
MCH: 33.6 pg (ref 26.0–34.0)
MCHC: 34.2 g/dL (ref 32.0–36.0)
MCV: 98.4 fL (ref 80.0–100.0)
MONO ABS: 0.7 10*3/uL (ref 0.2–0.9)
Monocytes Relative: 7 %
Neutro Abs: 7.3 10*3/uL — ABNORMAL HIGH (ref 1.4–6.5)
Neutrophils Relative %: 78 %
PLATELETS: 142 10*3/uL — AB (ref 150–440)
RBC: 3.51 MIL/uL — ABNORMAL LOW (ref 3.80–5.20)
RDW: 14.8 % — AB (ref 11.5–14.5)
WBC: 9.6 10*3/uL (ref 3.6–11.0)

## 2017-09-06 LAB — BASIC METABOLIC PANEL
Anion gap: 4 — ABNORMAL LOW (ref 5–15)
BUN: 10 mg/dL (ref 6–20)
CALCIUM: 8.8 mg/dL — AB (ref 8.9–10.3)
CO2: 25 mmol/L (ref 22–32)
Chloride: 110 mmol/L (ref 98–111)
Creatinine, Ser: 0.64 mg/dL (ref 0.44–1.00)
GFR calc Af Amer: >60 mL/min (ref >60)
GLUCOSE: 114 mg/dL — AB (ref 70–99)
Potassium: 3.9 mmol/L (ref 3.5–5.1)
SODIUM: 139 mmol/L (ref 135–145)

## 2017-09-06 LAB — HCG, QUANTITATIVE, PREGNANCY: HCG, BETA CHAIN, QUANT, S: 2069 m[IU]/mL — AB (ref <5)

## 2017-09-06 LAB — RPR: RPR: NONREACTIVE

## 2017-09-06 LAB — SURGICAL PATHOLOGY

## 2017-09-06 MED ORDER — FENTANYL CITRATE (PF) 100 MCG/2ML IJ SOLN: 50.0000 ug | Freq: Once | INTRAMUSCULAR | Status: DC

## 2017-09-06 MED ORDER — FENTANYL CITRATE (PF) 100 MCG/2ML IJ SOLN
25.0000 ug | Freq: Once | INTRAMUSCULAR | Status: AC
  Administered 2017-09-06: 25 ug via INTRAVENOUS
  Filled 2017-09-06: qty 2

## 2017-09-06 MED ORDER — ONDANSETRON HCL 4 MG/2ML IJ SOLN: 4.0000 mg | Freq: Once | INTRAMUSCULAR | Status: DC

## 2017-09-06 MED ORDER — SODIUM CHLORIDE 0.9 % IV BOLUS
1000.0000 mL | Freq: Once | INTRAVENOUS | Status: AC
  Administered 2017-09-06: 1000 mL via INTRAVENOUS

## 2017-09-06 NOTE — Discharge Instructions (Addendum)
Please seek medical attention for any high fevers, chest pain, shortness of breath, change in behavior, persistent vomiting, bloody stool or any other new or concerning symptoms.  

## 2017-09-06 NOTE — ED Notes (Signed)
Pt to US at this time.

## 2017-09-06 NOTE — ED Triage Notes (Signed)
Patient ambulatory to triage with steady gait, without difficulty or distress noted; "I think I have retained placenta"; c/o cramping and passing clots that began this morning; vag delivery on Sunday ([facility])

## 2017-09-06 NOTE — ED Provider Notes (Signed)
[facility] Emergency Department Provider Note   ____________________________________________   I have reviewed the triage vital signs and the nursing notes.   HISTORY  Chief Complaint Postpartum Complications   History limited by: Not Limited   HPI Christina Bautista is a 32 y.o. female who presents to the emergency department today because of concern for retained products after recent delivery. Patient had a vaginal delivery 2 days ago. States that this morning started developing bleeding and lower abdominal cramping. States she passed a roughly 2 inch long piece of tissue. Has continued to have some cramping since then. Denies any chest pain or shortness of breath. Is breastfeeding.   Per medical record review patient has a history of spontaneous vaginal delivery 2 days ago.  Past Medical History:  Diagnosis Date  . Obesity (BMI 35.0-39.9 without comorbidity)   . Pneumonia affecting pregnancy 2013/2014    Patient Active Problem List   Diagnosis Date Noted  . Indication for care in labor and delivery, antepartum 09/04/2017  . Postpartum care following vaginal delivery 09/04/2017  . History of macrosomia in infant in prior pregnancy, currently pregnant 08/31/2017  . BMI 34.0-34.9,adult 08/09/2017  . Maternal obesity, antepartum 08/02/2017  . Supervision of other normal pregnancy, antepartum 02/22/2017  . History of pneumonia 08/30/2011    Past Surgical History:  Procedure Laterality Date  . DILATION AND EVACUATION N/A 08/31/2016   Procedure: DILATATION AND EVACUATION;  Surgeon: Vena AustriaStaebler, Andreas, MD;  Location: [facility] ORS;  Service: Gynecology;  Laterality: N/A;    Prior to Admission medications   Medication Sig Start Date End Date Taking? Authorizing Provider  HYDROcodone-acetaminophen (NORCO/VICODIN) 5-325 MG tablet Take 1 tablet by mouth every 6 (six) hours as needed for up to 3 days for moderate pain or severe pain. 09/05/17 09/08/17  Tresea MallGledhill, Jane,  CNM  Prenatal Multivit-Min-Fe-FA (PRENATAL 1 + IRON PO) Take 1 tablet by mouth daily.    [provider]    Allergies Codeine  No family history on file.  Social History Social History   Tobacco Use  . Smoking status: Never Smoker  . Smokeless tobacco: Never Used  Substance Use Topics  . Alcohol use: No  . Drug use: No    Review of Systems Constitutional: No fever/chills Eyes: No visual changes. ENT: No sore throat. Cardiovascular: Denies chest pain. Respiratory: Denies shortness of breath. Gastrointestinal: Positive for lower abdominal pain Genitourinary: Positive for vaginal bleeding. Musculoskeletal: Negative for back pain. Skin: Negative for rash. Neurological: Negative for headaches, focal weakness or numbness.  ____________________________________________   PHYSICAL EXAM:  VITAL SIGNS: ED Triage Vitals  Enc Vitals Group     BP 09/06/17 0614 137/82     Pulse Rate 09/06/17 0614 87     Resp 09/06/17 0614 20     Temp 09/06/17 0614 98.3 F (36.8 C)     Temp Source 09/06/17 0614 Oral     SpO2 09/06/17 0614 97 %     Weight 09/06/17 0615 250 lb (113.4 kg)     Height 09/06/17 0615 5\' 9"  (1.753 m)     Head Circumference --      Peak Flow --      Pain Score 09/06/17 0615 7   Constitutional: Alert and oriented.  Eyes: Conjunctivae are normal.  ENT      Head: Normocephalic and atraumatic.      Nose: No congestion/rhinnorhea.      Mouth/Throat: Mucous membranes are moist.      Neck: No stridor. Hematological/Lymphatic/Immunilogical: No cervical  lymphadenopathy. Cardiovascular: Normal rate, regular rhythm.  No murmurs, rubs, or gallops.  Respiratory: Normal respiratory effort without tachypnea nor retractions. Breath sounds are clear and equal bilaterally. No wheezes/rales/rhonchi. Gastrointestinal: Soft and minimally tender in the lower abdomen. No rebound. No guarding.  Genitourinary: Deferred Musculoskeletal: Normal range of motion in all  extremities. No lower extremity edema. Neurologic:  Normal speech and language. No gross focal neurologic deficits are appreciated.  Skin:  Skin is warm, dry and intact. No rash noted. Psychiatric: Mood and affect are normal. Speech and behavior are normal. Patient exhibits appropriate insight and judgment.  ____________________________________________    LABS (pertinent positives/negatives)  CBC wbc 9.6, hgb 11.8, plt 142 BMP wnl except glu 114, ca 8.8 anion gap 4 ____________________________________________   EKG  None  ____________________________________________    RADIOLOGY  [place] Concern for hemorrhage vs retained products  ____________________________________________   PROCEDURES  Procedures  ____________________________________________   INITIAL IMPRESSION / ASSESSMENT AND PLAN / ED COURSE  Pertinent labs & imaging results that were available during my care of the patient were reviewed by me and considered in my medical decision making (see chart for details).   Patient presented to the emergency department today because of concerns for vaginal bleeding, passage of tissue and lower abdominal cramping.  Patient recently had a spontaneous vaginal delivery 2 days ago.  Concern for retained products.  Ultrasound was obtained.  Dr. Barbette Merino with OB/GYN did review the ultrasound.  At this point thinks unlikely patient has significant retained products.  Will continue outpatient to try to pass further blood on her own.  Discussed importance of follow-up.  Discussed return precautions.   ____________________________________________   FINAL CLINICAL IMPRESSION(S) / ED DIAGNOSES  Final diagnoses:  Vaginal bleeding     Note: This dictation was prepared with Dragon dictation. Any transcriptional errors that result from this process are unintentional     Phineas Semen, MD 09/06/17 [phone]

## 2017-09-14 ENCOUNTER — Encounter: Payer: Medicaid Other | Admitting: Maternal Newborn

## 2017-09-14 ENCOUNTER — Other Ambulatory Visit: Payer: Medicaid Other

## 2018-02-20 IMAGING — US US OB TRANSVAGINAL
1 series · 14 of 28 positions shown · non-contrast
Comparison: None.

CLINICAL DATA: Positive pregnancy test, vaginal bleeding, pelvic
cramping

EXAM:
OBSTETRIC <14 WK US AND TRANSVAGINAL OB US
TECHNIQUE: Both transabdominal and transvaginal ultrasound examinations were
performed for complete evaluation of the gestation as well as the
maternal uterus, adnexal regions, and pelvic cul-de-sac.
Transvaginal technique was performed to assess early pregnancy.

[Series 1: us ob transvaginal · 0.25mm/px · 14 of 109 slices shown]
[im 5/109]
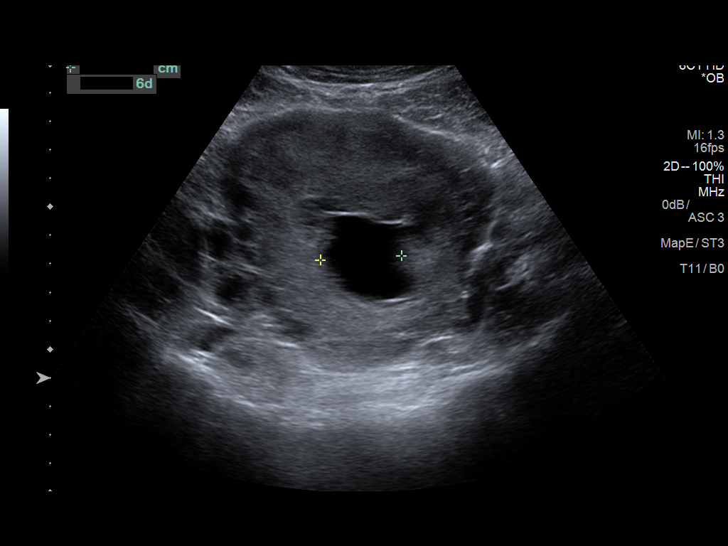
[im 13/109]
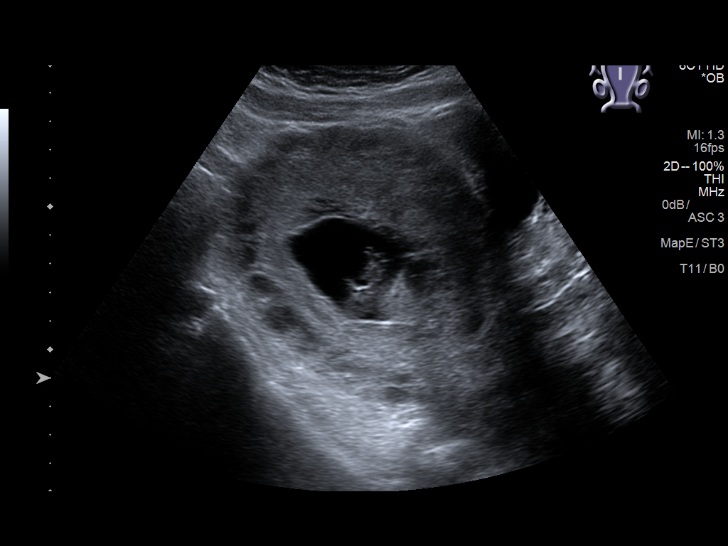
[im 21/109]
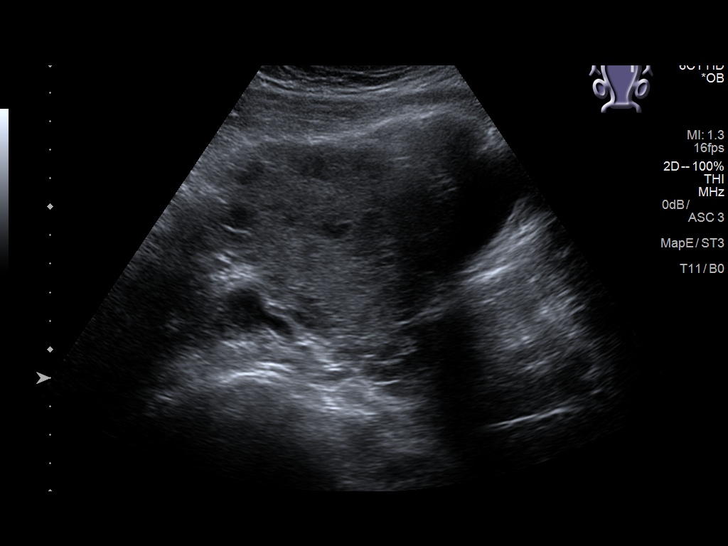
[im 29/109]
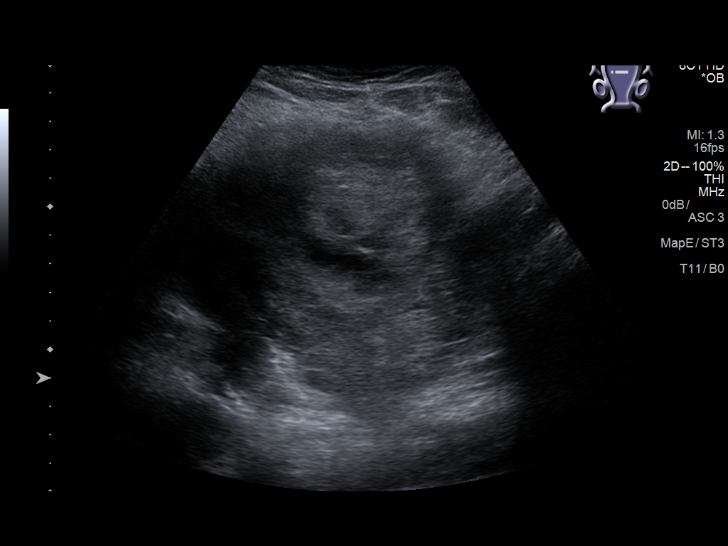
[im 37/109]
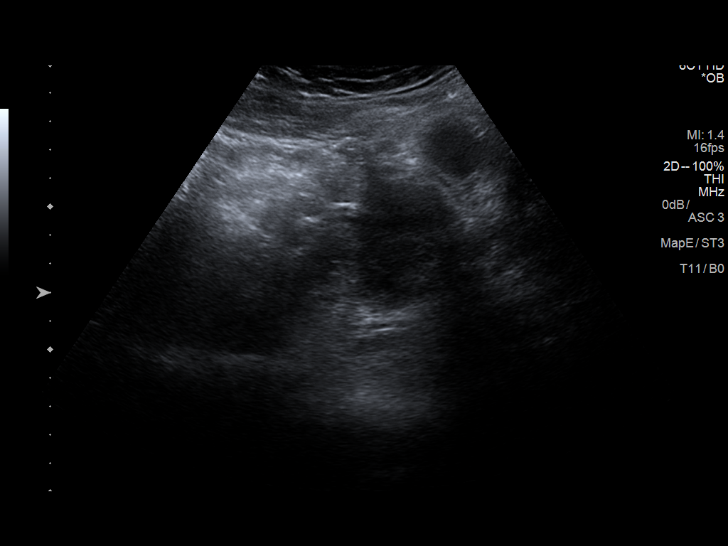
[im 45/109]
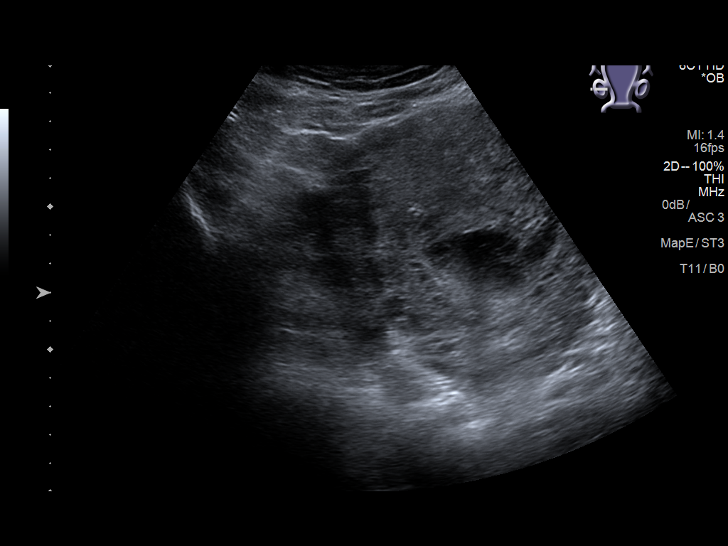
[im 53/109]
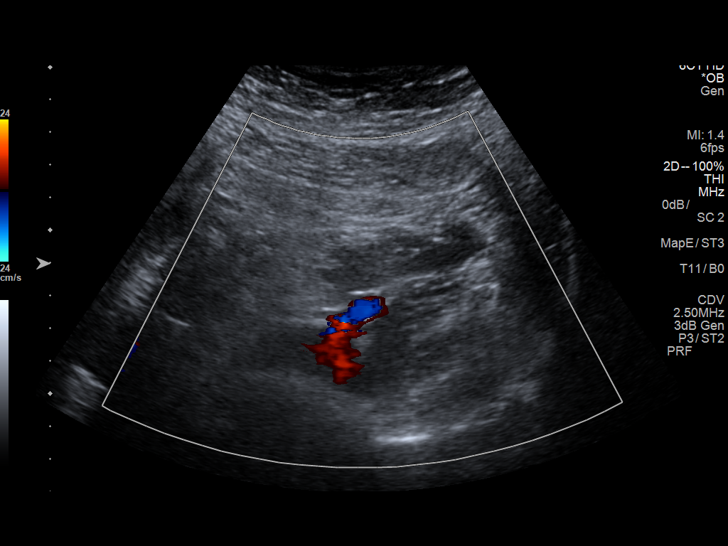
[im 61/109]
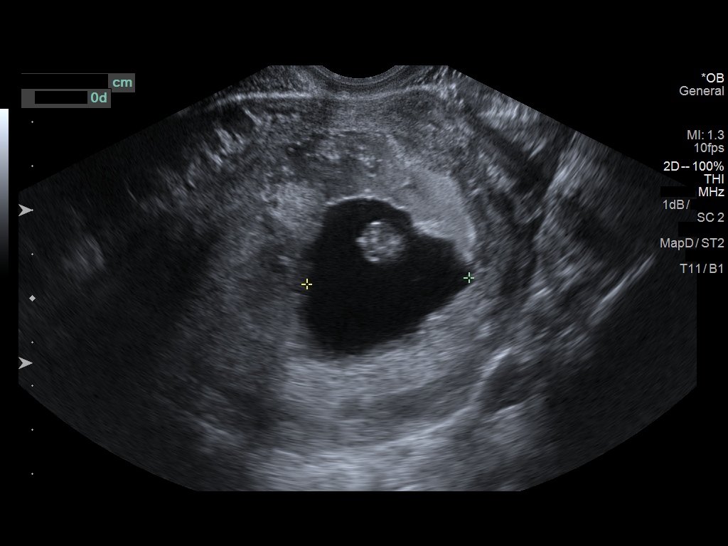
[im 69/109]
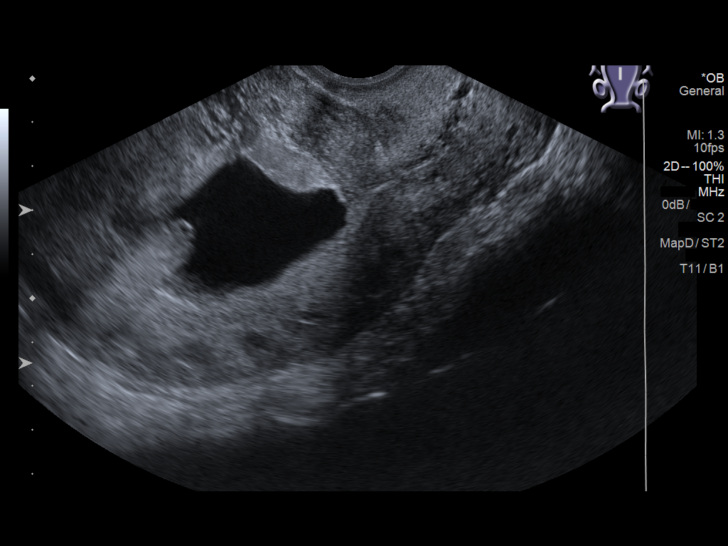
[im 77/109]
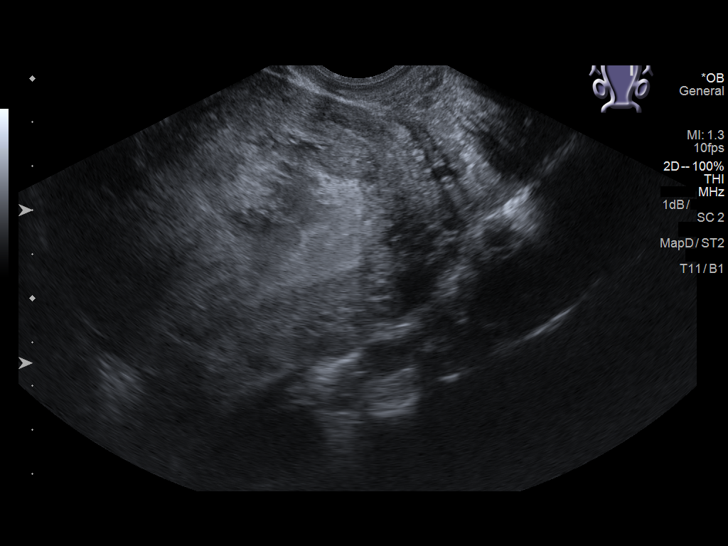
[im 85/109]
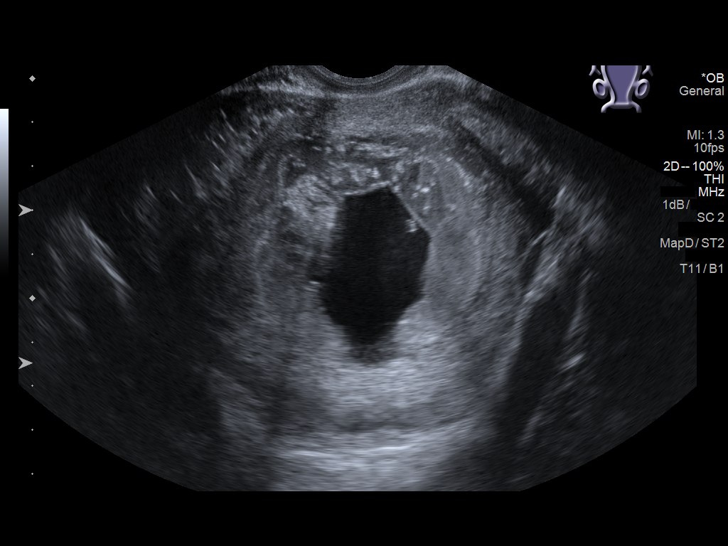
[im 93/109]
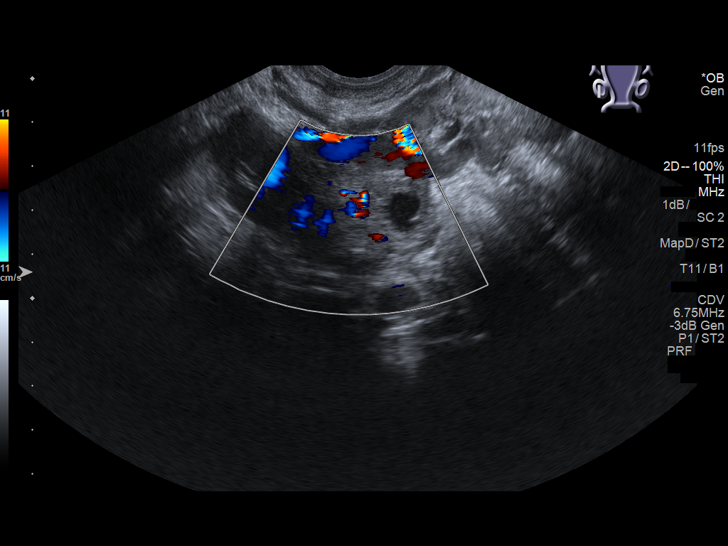
[im 101/109]
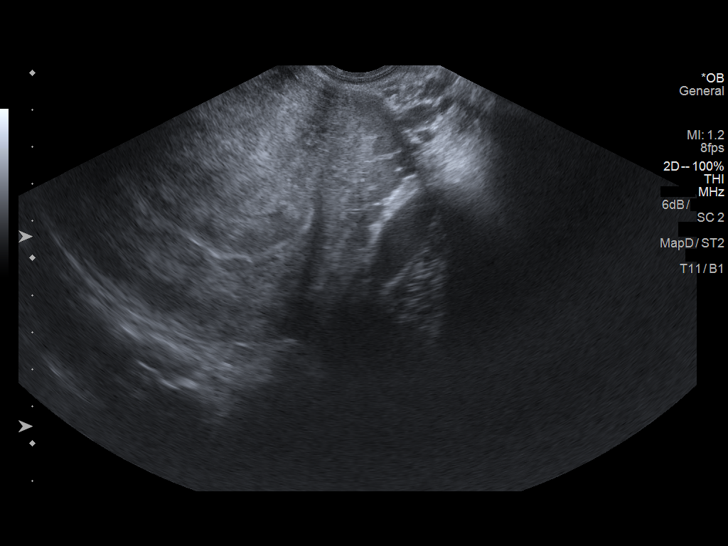
[im 109/109]
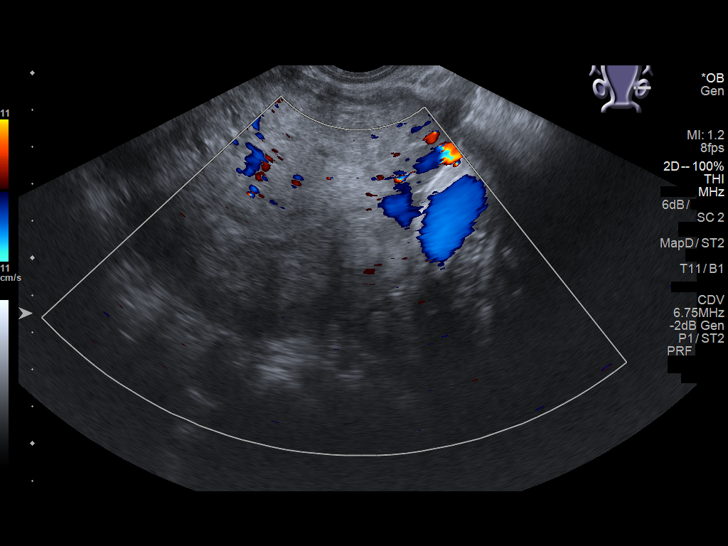

[14 of 28 positions shown; findings below may reference images not displayed]

FINDINGS: Intrauterine gestational sac: Single

Yolk sac:  Not Visualized.

Embryo:  Visualized.

Cardiac Activity: Not Visualized.

Heart Rate: Not detected

CRL:  2.7 cm   9 w   3 d

Subchorionic hemorrhage:  None visualized.

Maternal uterus/adnexae: Irregularly shaped gestational sac. Fetal
pole visualized but no detectable cardiac activity, motion, or heart
rate compatible with nonviable pregnancy.

Ovaries appear normal.  No pelvic free fluid.
IMPRESSION: Single IUP with a crown-rump length of 2.7 cm but no visualized
cardiac activity or detected heart rate.

Findings meet definitive criteria for failed pregnancy. This follows
SRU consensus guidelines: Diagnostic Criteria for Nonviable
Pregnancy Early in the First Trimester. N Engl J Med

## 2019-03-01 IMAGING — US US PELVIS COMPLETE
1 series · 13 of 25 positions shown · non-contrast
Comparison: None.

CLINICAL DATA: Status post vaginal delivery 2 days prior with post
delivery vaginal bleeding with clots. Abdominal cramping.

EXAM:
TRANSABDOMINAL ULTRASOUND OF PELVIS
TECHNIQUE: Transabdominal ultrasound examination of the pelvis was performed
including evaluation of the uterus, ovaries, adnexal regions, and
pelvic cul-de-sac.

[Series 1: us pelvis complete · 0.31mm/px · 13 of 63 slices shown]
[im 1/63]
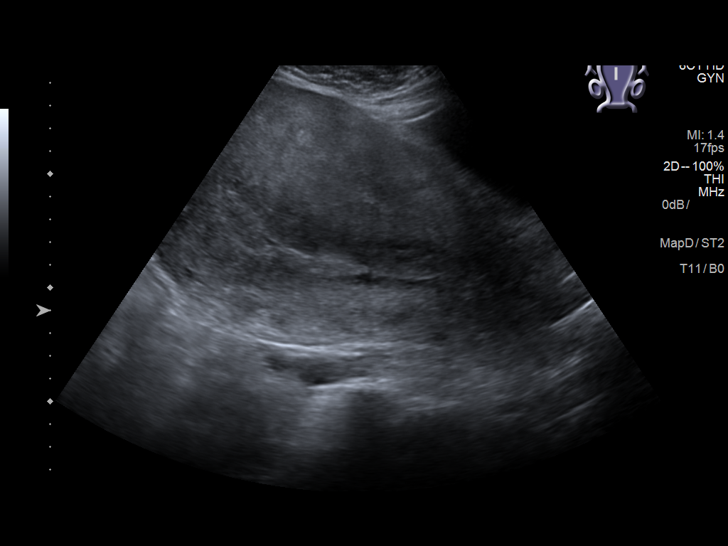
[im 6/63]
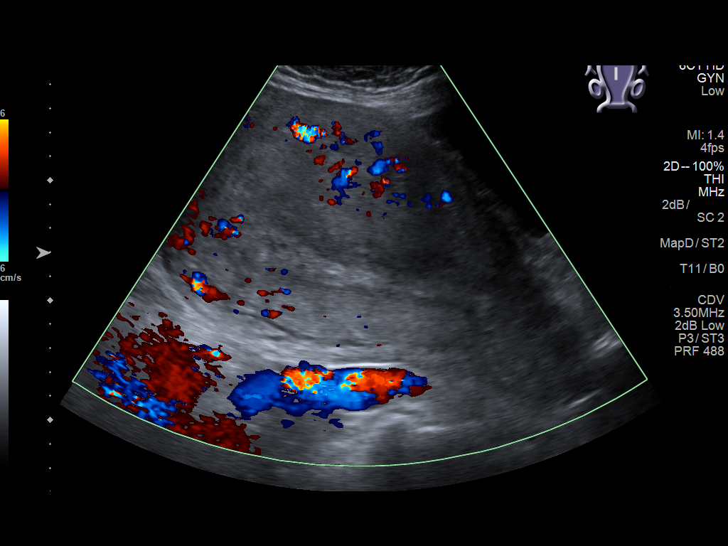
[im 11/63]
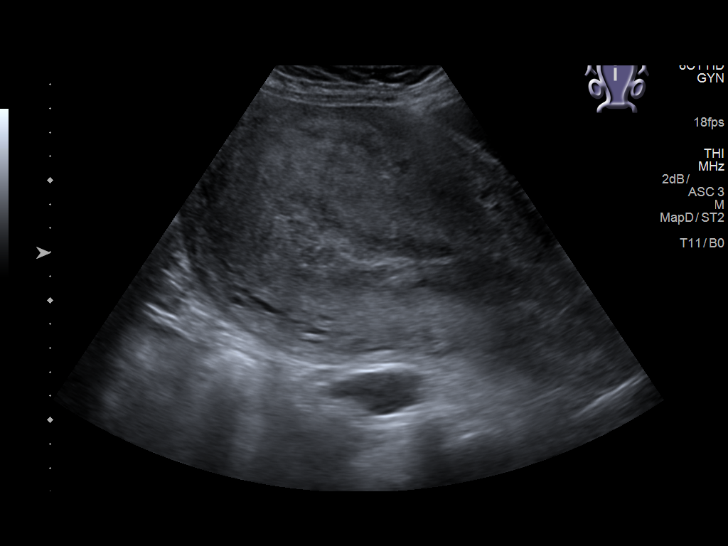
[im 16/63]
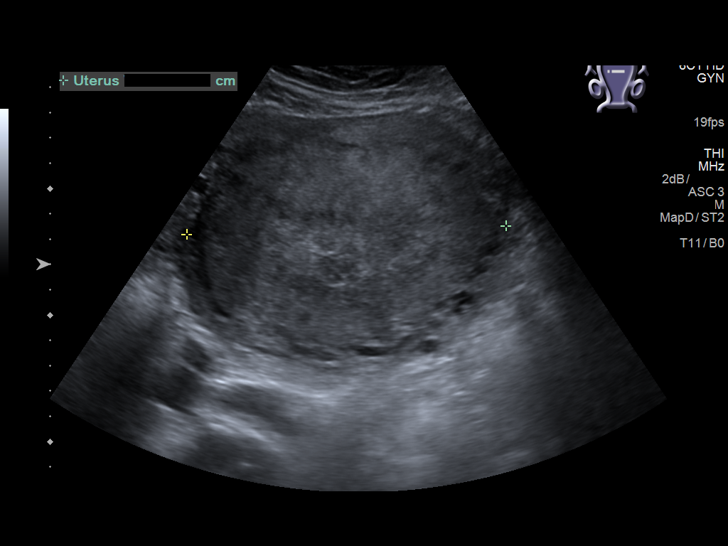
[im 21/63]
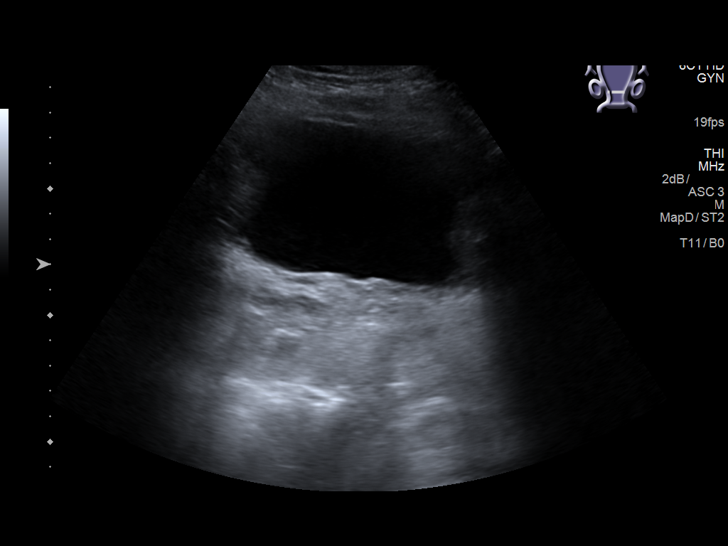
[im 26/63]
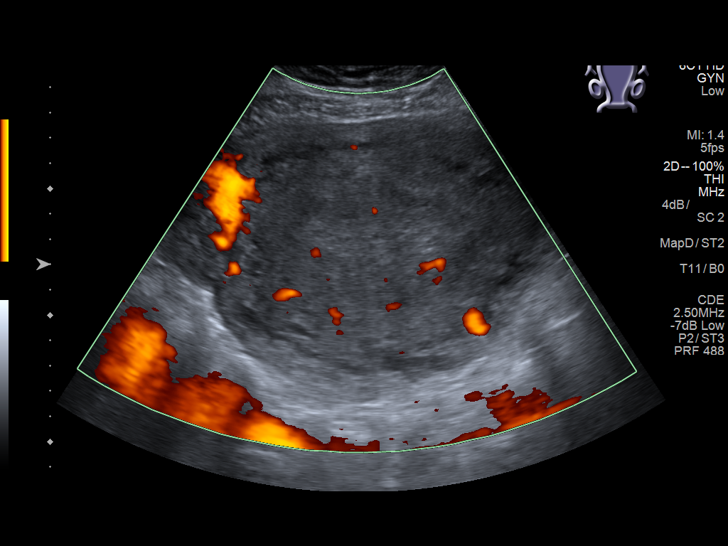
[im 32/63]
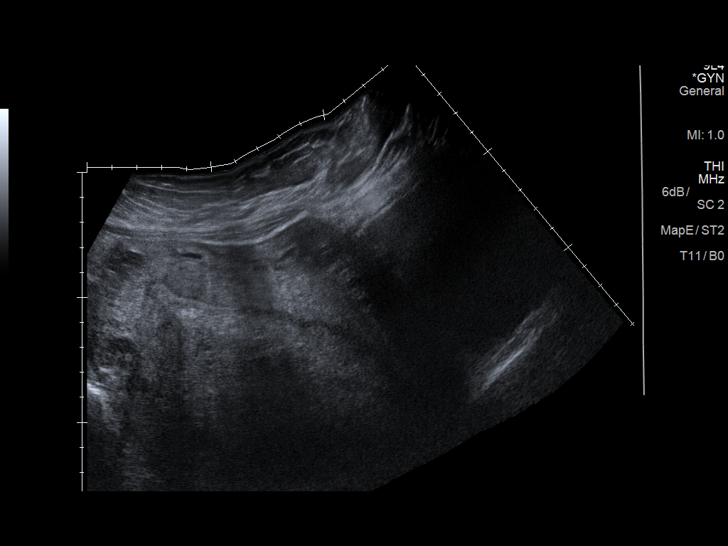
[im 37/63]
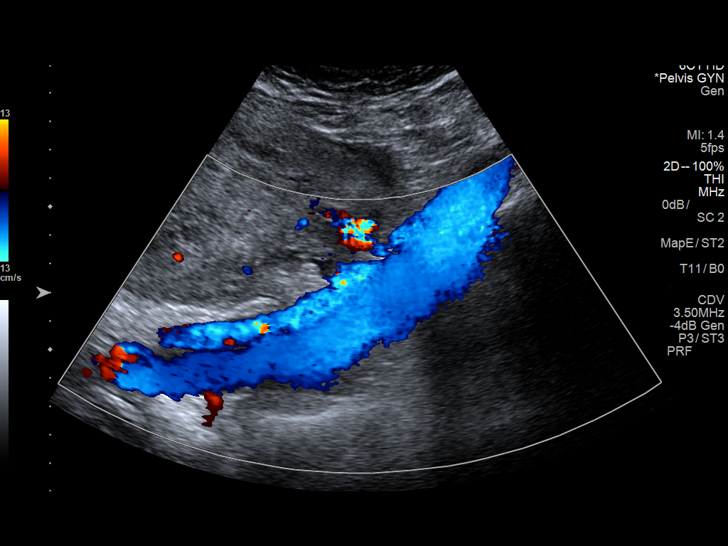
[im 42/63]
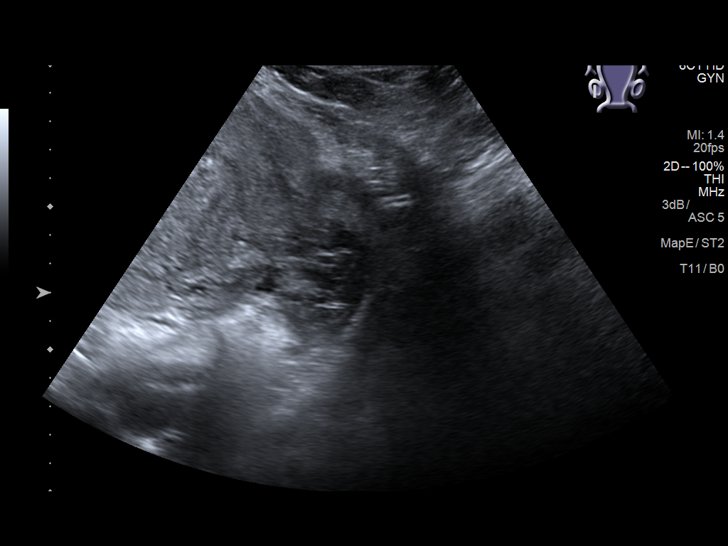
[im 47/63]
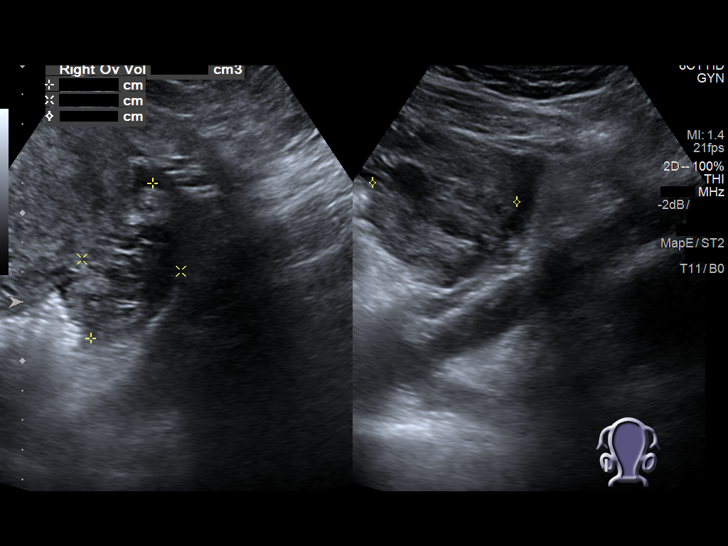
[im 52/63]
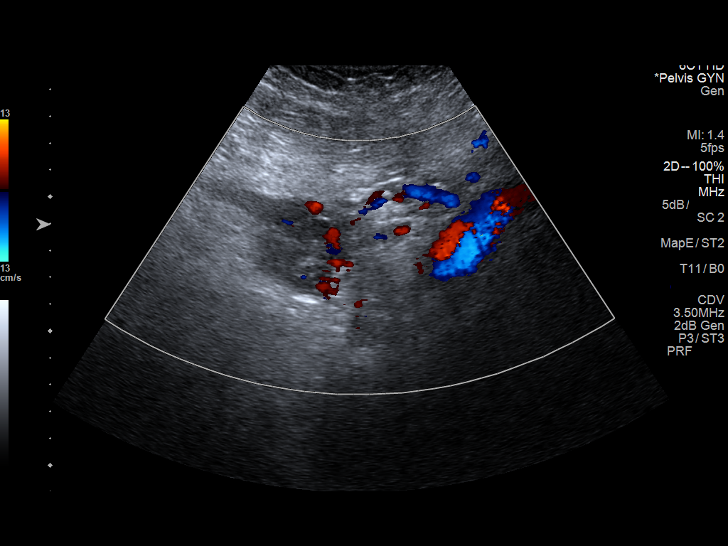
[im 57/63]
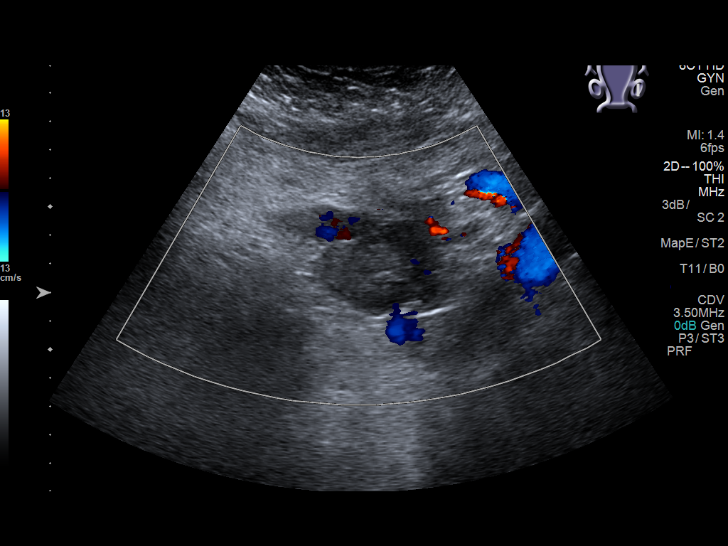
[im 63/63]
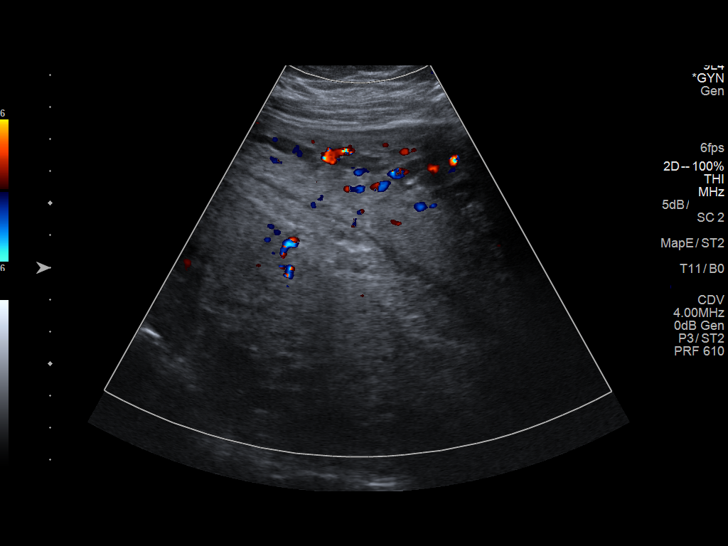

[13 of 25 positions shown; findings below may reference images not displayed]

FINDINGS: Uterus

Measurements: 18.8 x 11.2 x 12.6 cm.. No fibroids or other mass
visualized.

Endometrium

Thickness: 27 mm. Endometrium is inhomogeneous in appearance and
slightly irregular. Endometrium does not appear hypervascular.

Right ovary

Measurements: 5.6 x 3.9 x 4.9 cm. Normal appearance/no adnexal mass.

Left ovary

Measurements: 4.3 x 3.4 x 4.9 cm. Normal appearance/no adnexal mass.

Other findings:  No abnormal free fluid.
IMPRESSION: 1.  Uterus is overall enlarged consistent with postpartum state.

2. Thickened endometrium with inhomogeneous echotexture appearance.
Endometrium mildly irregular. While all of the inhomogeneous
echotexture material may represent hemorrhage, retained products of
conception cannot be excluded in this circumstance.

3.  No extrauterine pelvic or adnexal mass.  No free pelvic fluid.

## 2019-05-09 ENCOUNTER — Encounter: Payer: Self-pay | Admitting: Advanced Practice Midwife

## 2019-05-09 ENCOUNTER — Other Ambulatory Visit: Payer: Self-pay

## 2019-05-09 ENCOUNTER — Ambulatory Visit (INDEPENDENT_AMBULATORY_CARE_PROVIDER_SITE_OTHER): Payer: Medicaid Other | Admitting: Advanced Practice Midwife

## 2019-05-09 VITALS — BP 140/84 | HR 84 | Ht 69.0 in | Wt 259.0 lb

## 2019-05-09 DIAGNOSIS — Z0000 Encounter for general adult medical examination without abnormal findings: Secondary | ICD-10-CM | POA: Diagnosis not present

## 2019-05-09 NOTE — Progress Notes (Signed)
Gynecology Annual Exam   PCP: Patient, No Pcp Per  Chief Complaint:  Chief Complaint  Patient presents with  . Gynecologic Exam    Discuss Birthcontrol    History of Present Illness: Patient is a 34 y.o. [id number] presents for annual exam. The patient has no gyn complaints today. She is interested in Mirena IUD for birth control. Most recently she has been using natural family planning method. She is fairly certain she does not want any more children and wants LARC. We discussed risks/benefits/side effects. We discussed her elevated blood pressure and she is encouraged to increase healthy lifestyle.  LMP: Patient's last menstrual period was 04/28/2019. Average Interval: regular, 28 days Duration of flow: 4 days Heavy Menses: 2 heavy days Clots: no Intermenstrual Bleeding: no Postcoital Bleeding: no Dysmenorrhea: no  The patient is sexually active. She currently uses rhythm method for contraception. She denies dyspareunia.  The patient does perform self breast exams.  There is no notable family history of breast or ovarian cancer in her family.  The patient wears seatbelts: yes.   The patient has regular exercise: she is active with her 4 children and does yard work. She recently purchased a spin bike and plans to use that. She has been following a decreased carb diet with intermittent fasting. She drinks water and adds sugar substitute. We discussed possible health hazards associated with artificial sweeteners. She denies adequate sleep due to young children.    The patient denies current symptoms of depression.    Review of Systems: Review of Systems  Constitutional: Negative.   HENT: Negative.   Eyes: Negative.   Respiratory: Negative.   Cardiovascular: Negative.   Gastrointestinal: Negative.   Genitourinary: Negative.   Musculoskeletal: Negative.   Skin: Negative.   Neurological: Negative.   Endo/Heme/Allergies: Negative.   Psychiatric/Behavioral: Negative.     Past  Medical History:  Patient Active Problem List   Diagnosis Date Noted  . History of pneumonia 08/30/2011    Overview:  Treated [facility] ED as OP 07/2011     Past Surgical History:  Past Surgical History:  Procedure Laterality Date  . DILATION AND EVACUATION N/A 08/31/2016   Procedure: DILATATION AND EVACUATION;  Surgeon: Vena Austria, MD;  Location: [facility] ORS;  Service: Gynecology;  Laterality: N/A;    Gynecologic History:  Patient's last menstrual period was 04/28/2019. Contraception: rhythm method Last Pap: 2 years ago Results were: no abnormalities   Obstetric History: [id number]  Family History:  Family History  Problem Relation Age of Onset  . Kidney cancer Father   . Prostate cancer Father   . Colon cancer Maternal Grandmother     Social History:  Social History   Socioeconomic History  . Marital status: Married    Spouse name: Not on file  . Number of children: Not on file  . Years of education: Not on file  . Highest education level: Not on file  Occupational History  . Not on file  Tobacco Use  . Smoking status: Never Smoker  . Smokeless tobacco: Never Used  Substance and Sexual Activity  . Alcohol use: Yes    Comment: Occ  . Drug use: No  . Sexual activity: Yes    Birth control/protection: None  Other Topics Concern  . Not on file  Social History Narrative  . Not on file   Social Determinants of Health   Financial Resource Strain:   . Difficulty of Paying Living Expenses:   Food Insecurity:   .  Worried About Charity fundraiser in the Last Year:   . Arboriculturist in the Last Year:   Transportation Needs:   . Film/video editor (Medical):   Marland Kitchen Lack of Transportation (Non-Medical):   Physical Activity:   . Days of Exercise per Week:   . Minutes of Exercise per Session:   Stress:   . Feeling of Stress :   Social Connections:   . Frequency of Communication with Friends and Family:   . Frequency of Social Gatherings with Friends and Family:    . Attends Religious Services:   . Active Member of Clubs or Organizations:   . Attends Archivist Meetings:   Marland Kitchen Marital Status:   Intimate Partner Violence:   . Fear of Current or Ex-Partner:   . Emotionally Abused:   Marland Kitchen Physically Abused:   . Sexually Abused:     Allergies:  Allergies  Allergen Reactions  . Codeine Nausea Only and Other (See Comments)    Dizziness, lightheadedness    Medications: Prior to Admission medications   Not on File    Physical Exam Vitals: Blood pressure 140/84, pulse 84, height 5\' 9"  (1.753 m), weight 259 lb (117.5 kg), last menstrual period 04/28/2019  General: NAD HEENT: normocephalic, anicteric Thyroid: no enlargement, no palpable nodules Pulmonary: No increased work of breathing, CTAB Cardiovascular: RRR, distal pulses 2+ Breast: Breast symmetrical, no tenderness, no palpable nodules or masses, no skin or nipple retraction present, no nipple discharge.  No axillary or supraclavicular lymphadenopathy. Abdomen: NABS, soft, non-tender, non-distended.  Umbilicus without lesions.  No hepatomegaly, splenomegaly or masses palpable. No evidence of hernia  Genitourinary: Deferred for no concerns/PAP interval Extremities: no edema, erythema, or tenderness Neurologic: Grossly intact Psychiatric: mood appropriate, affect full   Assessment: 34 y.o. [id number] routine annual exam  Plan: Problem List Items Addressed This Visit    None      1) STI screening  was offered and declined  2)  ASCCP guidelines and rationale discussed.  Patient opts for every 3 years screening interval  3) Contraception - the patient is currently using  rhythm method.  She is interested in changing to [place] IUD  4) Routine healthcare maintenance including cholesterol, diabetes screening discussed Declines  5) Return for: call when period starts for Mirena placement.  6) Increase healthy lifestyle: continue healthy diet/consider discontinuing artificial  sweeteners, increase physical activity   Rod Can, [facility] 05/09/2019, 11:19 AM

## 2019-05-09 NOTE — Patient Instructions (Signed)
Health Maintenance, Female Adopting a healthy lifestyle and getting preventive care are important in promoting health and wellness. Ask your health care provider about:  The right schedule for you to have regular tests and exams.  Things you can do on your own to prevent diseases and keep yourself healthy. What should I know about diet, weight, and exercise? Eat a healthy diet   Eat a diet that includes plenty of vegetables, fruits, low-fat dairy products, and lean protein.  Do not eat a lot of foods that are high in solid fats, added sugars, or sodium. Maintain a healthy weight Body mass index (BMI) is used to identify weight problems. It estimates body fat based on height and weight. Your health care provider can help determine your BMI and help you achieve or maintain a healthy weight. Get regular exercise Get regular exercise. This is one of the most important things you can do for your health. Most adults should:  Exercise for at least 150 minutes each week. The exercise should increase your heart rate and make you sweat (moderate-intensity exercise).  Do strengthening exercises at least twice a week. This is in addition to the moderate-intensity exercise.  Spend less time sitting. Even light physical activity can be beneficial. Watch cholesterol and blood lipids Have your blood tested for lipids and cholesterol at 36 years of age, then have this test every 5 years. Have your cholesterol levels checked more often if:  Your lipid or cholesterol levels are high.  You are older than 36 years of age.  You are at high risk for heart disease. What should I know about cancer screening? Depending on your health history and family history, you may need to have cancer screening at various ages. This may include screening for:  Breast cancer.  Cervical cancer.  Colorectal cancer.  Skin cancer.  Lung cancer. What should I know about heart disease, diabetes, and high blood  pressure? Blood pressure and heart disease  High blood pressure causes heart disease and increases the risk of stroke. This is more likely to develop in people who have high blood pressure readings, are of African descent, or are overweight.  Have your blood pressure checked: ? Every 3-5 years if you are 18-39 years of age. ? Every year if you are 40 years old or older. Diabetes Have regular diabetes screenings. This checks your fasting blood sugar level. Have the screening done:  Once every three years after age 40 if you are at a normal weight and have a low risk for diabetes.  More often and at a younger age if you are overweight or have a high risk for diabetes. What should I know about preventing infection? Hepatitis B If you have a higher risk for hepatitis B, you should be screened for this virus. Talk with your health care provider to find out if you are at risk for hepatitis B infection. Hepatitis C Testing is recommended for:  Everyone born from 1945 through 1965.  Anyone with known risk factors for hepatitis C. Sexually transmitted infections (STIs)  Get screened for STIs, including gonorrhea and chlamydia, if: ? You are sexually active and are younger than 36 years of age. ? You are older than 36 years of age and your health care provider tells you that you are at risk for this type of infection. ? Your sexual activity has changed since you were last screened, and you are at increased risk for chlamydia or gonorrhea. Ask your health care provider if   you are at risk.  Ask your health care provider about whether you are at high risk for HIV. Your health care provider may recommend a prescription medicine to help prevent HIV infection. If you choose to take medicine to prevent HIV, you should first get tested for HIV. You should then be tested every 3 months for as long as you are taking the medicine. Pregnancy  If you are about to stop having your period (premenopausal) and  you may become pregnant, seek counseling before you get pregnant.  Take 400 to 800 micrograms (mcg) of folic acid every day if you become pregnant.  Ask for birth control (contraception) if you want to prevent pregnancy. Osteoporosis and menopause Osteoporosis is a disease in which the bones lose minerals and strength with aging. This can result in bone fractures. If you are 65 years old or older, or if you are at risk for osteoporosis and fractures, ask your health care provider if you should:  Be screened for bone loss.  Take a calcium or vitamin D supplement to lower your risk of fractures.  Be given hormone replacement therapy (HRT) to treat symptoms of menopause. Follow these instructions at home: Lifestyle  Do not use any products that contain nicotine or tobacco, such as cigarettes, e-cigarettes, and chewing tobacco. If you need help quitting, ask your health care provider.  Do not use street drugs.  Do not share needles.  Ask your health care provider for help if you need support or information about quitting drugs. Alcohol use  Do not drink alcohol if: ? Your health care provider tells you not to drink. ? You are pregnant, may be pregnant, or are planning to become pregnant.  If you drink alcohol: ? Limit how much you use to 0-1 drink a day. ? Limit intake if you are breastfeeding.  Be aware of how much alcohol is in your drink. In the [place] one drink equals one 12 oz bottle of beer (355 mL), one 5 oz glass of wine (148 mL), or one 1 oz glass of hard liquor (44 mL). General instructions  Schedule regular health, dental, and eye exams.  Stay current with your vaccines.  Tell your health care provider if: ? You often feel depressed. ? You have ever been abused or do not feel safe at home. Summary  Adopting a healthy lifestyle and getting preventive care are important in promoting health and wellness.  Follow your health care provider's instructions about healthy  diet, exercising, and getting tested or screened for diseases.  Follow your health care provider's instructions on monitoring your cholesterol and blood pressure. This information is not intended to replace advice given to you by your health care provider. Make sure you discuss any questions you have with your health care provider. Document Revised: 01/25/2018 Document Reviewed: 01/25/2018 Elsevier Patient Education  2020 Elsevier Inc.  

## 2019-05-29 ENCOUNTER — Telehealth: Payer: Self-pay | Admitting: Obstetrics and Gynecology

## 2019-05-29 NOTE — Telephone Encounter (Signed)
Patient is schedule for Mirena placement for 05/30/19 at 8:30 with ABC

## 2019-05-29 NOTE — Patient Instructions (Addendum)
I value your feedback and entrusting us with your care. If you get a [facility] patient survey, I would appreciate you taking the time to let us know about your experience today. Thank you!  As of January 25, 2019, your lab results will be released to your MyChart immediately, before I even have a chance to see them. Please give me time to review them and contact you if there are any abnormalities. Thank you for your patience.   [facility] [phone]  Instructions after IUD insertion  Most women experience no significant problems after insertion of an IUD, however minor cramping and spotting for a few days is common. Cramps may be treated with ibuprofen 800mg every 8 hours or Tylenol 650 mg every 4 hours. Contact [facility] immediately if you experience any of the following symptoms during the next week: temperature >99.6 degrees, worsening pelvic pain, abdominal pain, fainting, unusually heavy vaginal bleeding, foul vaginal discharge, or if you think you have expelled the IUD.  Nothing inserted in the vagina for 48 hours. You will be scheduled for a follow up visit in approximately four weeks.  You should check monthly to be sure you can feel the IUD strings in the upper vagina. If you are having a monthly period, try to check after each period. If you cannot feel the IUD strings,  contact [facility] immediately so we can do an exam to determine if the IUD has been expelled.   Please use backup protection until we can confirm the IUD is in place.  Call [facility] if you are exposed to or diagnosed with a sexually transmitted infection, as we will need to discuss whether it is safe for you to continue using an IUD.   

## 2019-05-29 NOTE — Telephone Encounter (Signed)
Noted. Mirena reserved for this patient. 

## 2019-05-29 NOTE — Progress Notes (Signed)
   Chief Complaint  Patient presents with  . Contraception    Mirena insertion     IUD PROCEDURE NOTE:  Christina Bautista is a 34 y.o. [phone] here for Mirena  IUD insertion for [facility]. Currently doing NFP. Had annual with [facility] 3/21   BP 130/90   Ht 5\' 9"  (1.753 m)   Wt 254 lb (115.2 kg)   LMP 05/28/2019 (Exact Date)   Breastfeeding No   BMI 37.51 kg/m   IUD Insertion Procedure Note Patient identified, informed consent performed, consent signed.   Discussed risks of irregular bleeding, cramping, infection, malpositioning or misplacement of the IUD outside the uterus which may require further procedure such as laparoscopy, risk of failure <1%. Time out was performed.    Speculum placed in the vagina.  Cervix visualized.  Cleaned with Betadine x 2.  Grasped anteriorly with a single tooth tenaculum.  Uterus sounded to 8.0 cm.   IUD placed per manufacturer's recommendations.  Strings trimmed to 3 cm. Tenaculum was removed, good hemostasis noted.  Patient tolerated procedure well.   ASSESSMENT:  Encounter for insertion of intrauterine contraceptive device (IUD) - Plan: levonorgestrel (MIRENA, 52 MG,) 20 MCG/24HR IUD  Screening for STD (sexually transmitted disease) - Plan: Cervicovaginal ancillary only   Meds ordered this encounter  Medications  . levonorgestrel (MIRENA, 52 MG,) 20 MCG/24HR IUD    Sig: 1 Intra Uterine Device (1 each total) by Intrauterine route once for 1 dose.    Dispense:  1 Intra Uterine Device    Refill:  0    Order Specific Question:   Supervising Provider    Answer:   07/28/2019 Nadara Mustard     Plan:  Patient was given post-procedure instructions.  She was advised to have backup contraception for one week.   Call if you are having increasing pain, cramps or bleeding or if you have a fever greater than 100.4 degrees F., shaking chills, nausea or vomiting. Patient was also asked to check IUD strings periodically and follow up in 4 weeks for IUD  check.  Return in about 4 weeks (around 06/27/2019) for IUD f/u.  Kaytlen Lightsey B. Jerrion Tabbert, PA-C 05/30/2019 8:49 AM

## 2019-05-30 ENCOUNTER — Ambulatory Visit (INDEPENDENT_AMBULATORY_CARE_PROVIDER_SITE_OTHER): Payer: Medicaid Other | Admitting: Obstetrics and Gynecology

## 2019-05-30 ENCOUNTER — Other Ambulatory Visit: Payer: Self-pay

## 2019-05-30 ENCOUNTER — Encounter: Payer: Self-pay | Admitting: Obstetrics and Gynecology

## 2019-05-30 ENCOUNTER — Other Ambulatory Visit (HOSPITAL_COMMUNITY)
Admission: RE | Admit: 2019-05-30 | Discharge: 2019-05-30 | Disposition: A | Discharge status: Home / Self Care | Payer: Medicaid Other | Source: Ambulatory Visit | Attending: Obstetrics and Gynecology | Admitting: Obstetrics and Gynecology

## 2019-05-30 VITALS — BP 130/90 | Ht 69.0 in | Wt 254.0 lb

## 2019-05-30 DIAGNOSIS — Z113 Encounter for screening for infections with a predominantly sexual mode of transmission: Secondary | ICD-10-CM | POA: Insufficient documentation

## 2019-05-30 DIAGNOSIS — Z30430 Encounter for insertion of intrauterine contraceptive device: Secondary | ICD-10-CM

## 2019-05-30 MED ORDER — MIRENA (52 MG) 20 MCG/24HR IU IUD: 1.0000 | INTRAUTERINE_SYSTEM | Freq: Once | INTRAUTERINE | 0 refills | Status: DC

## 2019-05-31 LAB — CERVICOVAGINAL ANCILLARY ONLY
Chlamydia: NEGATIVE
Comment: NEGATIVE
Comment: NORMAL
Neisseria Gonorrhea: NEGATIVE

## 2019-06-27 ENCOUNTER — Ambulatory Visit: Payer: Medicaid Other | Admitting: Obstetrics and Gynecology

## 2019-06-27 NOTE — Progress Notes (Deleted)
   No chief complaint on file.    History of Present Illness:  Tyan Lasure is a 34 y.o. that had a Mirena IUD placed approximately 1 month ago. Since that time, she denies dyspareunia, pelvic pain, non-menstrual bleeding, vaginal d/c, heavy bleeding.   Review of Systems  Physical Exam:  LMP 05/28/2019 (Exact Date)  There is no height or weight on file to calculate BMI.  Pelvic exam:  Two IUD strings {DESC; PRESENT/ABSENT:17923::"present"} seen coming from the cervical os. EGBUS, vaginal vault and cervix: within normal limits   Assessment:   No diagnosis found.  IUD strings present in proper location; pt doing well  Plan: F/u if any signs of infection or can no longer feel the strings.   Kenlie Seki B. Audrea Bolte, PA-C 06/27/2019 9:08 AM

## 2021-01-16 NOTE — Telephone Encounter (Signed)
Mirena rcvd/charged 05/29/2019

## 2022-10-17 NOTE — Progress Notes (Signed)
New patient visit  Patient: Christina Bautista   DOB: [birth year]   37 y.o. Female  MRN: [id number] Visit Date: 10/21/2022  Today's healthcare provider: Debera Lat, PA-C   Chief Complaint  Patient presents with   Establish Care    Patient would like to discuss weight management, no prior weight loss medications, elevated hormones may have a part to play in weight gain, would also like to have bp evaluated. Stated it has been up in the past week, no stress just exercise    Subjective    Christina Bautista is a 37 y.o. female who presents today as a new patient to establish care.   Discussed the use of AI scribe software for clinical note transcription with the patient, who gave verbal consent to proceed.  History of Present Illness   The patient, with a history of depression and anxiety, presents for primary care establishment after a recent unintended pregnancy and subsequent abortion. She reports that prior to the pregnancy, she was successfully losing weight through meal prepping and regular exercise, having lost 70 pounds over the course of a year. However, the unexpected pregnancy and subsequent abortion led to a period of depression and weight gain. She is now seeking assistance with weight loss and getting back on track with her health.  The patient also reports ongoing anxiety, which manifests as panic attacks and excessive worrying about unlikely scenarios. She has not previously taken medication for this and would prefer to manage it through lifestyle changes, such as returning to regular exercise. She has recently started going back to the gym and is hopeful that this will help manage her anxiety.  In addition to these concerns, the patient has a family history of diabetes and hypertension, with both her mother and possibly her father affected. She is also in need of an OBGYN for regular check-ups and Pap smears, having not been back since the birth of her daughter.       Past  Medical History:  Diagnosis Date   Obesity (BMI 35.0-39.9 without comorbidity)    Pneumonia affecting pregnancy 2013/2014   Past Surgical History:  Procedure Laterality Date   DILATION AND EVACUATION N/A 08/31/2016   Procedure: DILATATION AND EVACUATION;  Surgeon: Vena Austria, MD;  Location: [facility] ORS;  Service: Gynecology;  Laterality: N/A;   Family Status  Relation Name Status   Father  Deceased   MGM  Deceased  No partnership data on file   Family History  Problem Relation Age of Onset   Kidney cancer Father    Prostate cancer Father    Colon cancer Maternal Grandmother    Social History   Socioeconomic History   Marital status: Married    Spouse name: Not on file   Number of children: Not on file   Years of education: Not on file   Highest education level: GED or equivalent  Occupational History   Not on file  Tobacco Use   Smoking status: Never   Smokeless tobacco: Never  Vaping Use   Vaping status: Never Used  Substance and Sexual Activity   Alcohol use: Yes    Comment: Occ   Drug use: No   Sexual activity: Yes    Birth control/protection: None  Other Topics Concern   Not on file  Social History Narrative   Not on file   Social Determinants of Health   Financial Resource Strain: Low Risk  (10/20/2022)   Overall Financial Resource Strain (CARDIA)    Difficulty of Paying  Living Expenses: Not hard at all  Food Insecurity: No Food Insecurity (10/20/2022)   Hunger Vital Sign    Worried About Running Out of Food in the Last Year: Never true    Ran Out of Food in the Last Year: Never true  Transportation Needs: No Transportation Needs (10/20/2022)   PRAPARE - Administrator, Civil Service (Medical): No    Lack of Transportation (Non-Medical): No  Physical Activity: Sufficiently Active (10/20/2022)   Exercise Vital Sign    Days of Exercise per Week: 3 days    Minutes of Exercise per Session: 60 min  Stress: No Stress Concern Present (10/20/2022)    [employer] of Occupational Health - Occupational Stress Questionnaire    Feeling of Stress : Only a little  Social Connections: Socially Isolated (10/20/2022)   Social Connection and Isolation Panel [NHANES]    Frequency of Communication with Friends and Family: Never    Frequency of Social Gatherings with Friends and Family: Never    Attends Religious Services: Never    Database administrator or Organizations: No    Attends Engineer, structural: Not on file    Marital Status: Married   Outpatient Medications Prior to Visit  Medication Sig   [DISCONTINUED] levonorgestrel (MIRENA, 52 MG,) 20 MCG/24HR IUD 1 Intra Uterine Device (1 each total) by Intrauterine route once for 1 dose.   No facility-administered medications prior to visit.   Allergies  Allergen Reactions   Codeine Nausea Only and Other (See Comments)    Dizziness, lightheadedness    Immunization History  Administered Date(s) Administered   Tdap 07/18/2017    Health Maintenance  Topic Date Due   Hepatitis C Screening  Never done   PAP SMEAR-Modifier  02/23/2020   INFLUENZA VACCINE  09/16/2022   COVID-19 Vaccine (1 - 2023-24 season) Never done   DTaP/Tdap/Td (2 - Td or Tdap) 07/19/2027   HIV Screening  Completed   HPV VACCINES  Aged Out    Patient Care Team: Patient, No Pcp Per as PCP - General (General Practice)  Review of Systems  All other systems reviewed and are negative.  Except see HPI       Objective    BP (!) 133/91 (BP Location: Left Arm, Patient Position: Sitting, Cuff Size: Large)   Pulse 86   Ht 5\' 9"  (1.753 m)   Wt 257 lb (116.6 kg)   SpO2 100%   BMI 37.95 kg/m     Physical Exam Vitals reviewed.  Constitutional:      General: She is not in acute distress.    Appearance: Normal appearance. She is well-developed. She is not diaphoretic.  HENT:     Head: Normocephalic and atraumatic.  Eyes:     General: No scleral icterus.    Conjunctiva/sclera: Conjunctivae  normal.  Neck:     Thyroid: No thyromegaly.  Cardiovascular:     Rate and Rhythm: Normal rate and regular rhythm.     Pulses: Normal pulses.     Heart sounds: Normal heart sounds. No murmur heard. Pulmonary:     Effort: Pulmonary effort is normal. No respiratory distress.     Breath sounds: Normal breath sounds. No wheezing, rhonchi or rales.  Musculoskeletal:     Cervical back: Neck supple.     Right lower leg: No edema.     Left lower leg: No edema.  Lymphadenopathy:     Cervical: No cervical adenopathy.  Skin:    General: Skin is  warm and dry.     Findings: No rash.  Neurological:     Mental Status: She is alert and oriented to person, place, and time. Mental status is at baseline.  Psychiatric:        Mood and Affect: Mood normal.        Behavior: Behavior normal.     Depression Screen    10/21/2022   10:57 AM  PHQ 2/9 Scores  PHQ - 2 Score 0  PHQ- 9 Score 2   No results found for any visits on 10/21/22.  Assessment & Plan        Obesity Chronic, worsening Weight Management History of significant weight loss (70 lbs) but recent weight gain following an unplanned pregnancy and subsequent abortion. Expressed interest in medical weight loss program. -Check insurance coverage for weight loss program and medications such as wegovy or Zepbound. -Encourage regular physical activity and healthy diet. -Refer to medical weight loss program if covered by insurance.  Depression/Anxiety Flowsheet Row Office Visit from 10/21/2022 in [facility]  PHQ-9 Total Score 2          10/21/2022   10:57 AM  GAD 7 : Generalized Anxiety Score  Nervous, Anxious, on Edge 0  Control/stop worrying 1  Worry too much - different things 1  Trouble relaxing 1  Restless 0  Easily annoyed or irritable 1  Afraid - awful might happen 1  Total GAD 7 Score 5    Reports of feeling down and having panic attacks, particularly following recent abortion. No current  treatment. -Monitor mental health status. -Encourage regular physical activity and self-care. -Consider future referral for therapy or medication if symptoms persist or worsen.  Elevated BP New problem Bp today was 133/91 Family history of hypertension. No current diagnosis or treatment. -Advise patient to purchase home blood pressure cuff and monitor blood pressure daily. -Review blood pressure readings in two weeks to determine if medication is needed.  General Health Maintenance -Order comprehensive lab work including lipid panel, diabetes screening, kidney and liver function tests, hemoglobin level, and thyroid function tests. -Advise patient to establish care with an OBGYN for routine screenings and follow-up after recent abortion. -Schedule follow-up appointment in two weeks to review lab results and blood pressure readings.    Encounter to establish care Welcomed to our clinic Reviewed past medical hx, social hx, family hx and surgical hx Pt advised to send all vaccination records or screening   Return in about 2 weeks (around 11/04/2022) for BP f/u.    The patient was advised to call back or seek an in-person evaluation if the symptoms worsen or if the condition fails to improve as anticipated.  I discussed the assessment and treatment plan with the patient. The patient was provided an opportunity to ask questions and all were answered. The patient agreed with the plan and demonstrated an understanding of the instructions.  I, Debera Lat, PA-C have reviewed all documentation for this visit. The documentation on  09/20/22 for the exam, diagnosis, procedures, and orders are all accurate and complete.  Debera Lat, [facility], MMS [facility] [phone] (phone) [phone] (fax)  [facility]

## 2022-10-21 ENCOUNTER — Ambulatory Visit (INDEPENDENT_AMBULATORY_CARE_PROVIDER_SITE_OTHER): Payer: Commercial Managed Care - PPO | Admitting: Physician Assistant

## 2022-10-21 ENCOUNTER — Encounter: Payer: Self-pay | Admitting: Physician Assistant

## 2022-10-21 VITALS — BP 133/91 | HR 86 | Ht 69.0 in | Wt 257.0 lb

## 2022-10-21 DIAGNOSIS — Z7689 Persons encountering health services in other specified circumstances: Secondary | ICD-10-CM

## 2022-10-21 DIAGNOSIS — R030 Elevated blood-pressure reading, without diagnosis of hypertension: Secondary | ICD-10-CM | POA: Diagnosis not present

## 2022-10-21 DIAGNOSIS — Z833 Family history of diabetes mellitus: Secondary | ICD-10-CM

## 2022-10-21 DIAGNOSIS — E669 Obesity, unspecified: Secondary | ICD-10-CM | POA: Diagnosis not present

## 2022-10-21 DIAGNOSIS — Z124 Encounter for screening for malignant neoplasm of cervix: Secondary | ICD-10-CM

## 2022-10-22 LAB — COMPREHENSIVE METABOLIC PANEL
ALT: 12 IU/L (ref 0–32)
AST: 17 IU/L (ref 0–40)
Albumin: 4.4 g/dL (ref 3.9–4.9)
Alkaline Phosphatase: 53 IU/L (ref 44–121)
BUN/Creatinine Ratio: 14 (ref 9–23)
BUN: 15 mg/dL (ref 6–20)
Bilirubin Total: 0.5 mg/dL (ref 0.0–1.2)
CO2: 22 mmol/L (ref 20–29)
Calcium: 9.6 mg/dL (ref 8.7–10.2)
Chloride: 103 mmol/L (ref 96–106)
Creatinine, Ser: 1.07 mg/dL — ABNORMAL HIGH (ref 0.57–1.00)
Globulin, Total: 2.8 g/dL (ref 1.5–4.5)
Glucose: 81 mg/dL (ref 70–99)
Potassium: 4.4 mmol/L (ref 3.5–5.2)
Sodium: 137 mmol/L (ref 134–144)
Total Protein: 7.2 g/dL (ref 6.0–8.5)
eGFR: 68 mL/min/{1.73_m2} (ref >59)

## 2022-10-22 LAB — CBC WITH DIFFERENTIAL/PLATELET
Basophils Absolute: 0 10*3/uL (ref 0.0–0.2)
Basos: 1 %
EOS (ABSOLUTE): 0 10*3/uL (ref 0.0–0.4)
Eos: 1 %
Hematocrit: 36.7 % (ref 34.0–46.6)
Hemoglobin: 11.5 g/dL (ref 11.1–15.9)
Immature Grans (Abs): 0 10*3/uL (ref 0.0–0.1)
Immature Granulocytes: 0 %
Lymphocytes Absolute: 1.3 10*3/uL (ref 0.7–3.1)
Lymphs: 20 %
MCH: 26.1 pg — ABNORMAL LOW (ref 26.6–33.0)
MCHC: 31.3 g/dL — ABNORMAL LOW (ref 31.5–35.7)
MCV: 83 fL (ref 79–97)
Monocytes Absolute: 0.5 10*3/uL (ref 0.1–0.9)
Monocytes: 8 %
Neutrophils Absolute: 4.6 10*3/uL (ref 1.4–7.0)
Neutrophils: 70 %
Platelets: 309 10*3/uL (ref 150–450)
RBC: 4.4 x10E6/uL (ref 3.77–5.28)
RDW: 14.4 % (ref 11.7–15.4)
WBC: 6.4 10*3/uL (ref 3.4–10.8)

## 2022-10-22 LAB — LIPID PANEL
Chol/HDL Ratio: 3.3 ratio (ref 0.0–4.4)
Cholesterol, Total: 239 mg/dL — ABNORMAL HIGH (ref 100–199)
HDL: 73 mg/dL (ref >39)
LDL Chol Calc (NIH): 150 mg/dL — ABNORMAL HIGH (ref 0–99)
Triglycerides: 91 mg/dL (ref 0–149)
VLDL Cholesterol Cal: 16 mg/dL (ref 5–40)

## 2022-10-22 LAB — TSH: TSH: 1.82 u[IU]/mL (ref 0.450–4.500)

## 2022-10-22 LAB — HEMOGLOBIN A1C
Est. average glucose Bld gHb Est-mCnc: 91 mg/dL
Hgb A1c MFr Bld: 4.8 % (ref 4.8–5.6)

## 2022-10-23 DIAGNOSIS — Z833 Family history of diabetes mellitus: Secondary | ICD-10-CM | POA: Insufficient documentation

## 2022-10-23 DIAGNOSIS — E669 Obesity, unspecified: Secondary | ICD-10-CM | POA: Insufficient documentation

## 2022-10-23 DIAGNOSIS — R030 Elevated blood-pressure reading, without diagnosis of hypertension: Secondary | ICD-10-CM | POA: Insufficient documentation

## 2022-10-28 ENCOUNTER — Encounter: Payer: Self-pay | Admitting: Physician Assistant

## 2022-11-05 ENCOUNTER — Ambulatory Visit (INDEPENDENT_AMBULATORY_CARE_PROVIDER_SITE_OTHER): Payer: Commercial Managed Care - PPO | Admitting: Physician Assistant

## 2022-11-05 ENCOUNTER — Other Ambulatory Visit: Payer: Self-pay | Admitting: Physician Assistant

## 2022-11-05 ENCOUNTER — Other Ambulatory Visit: Payer: Self-pay

## 2022-11-05 VITALS — BP 139/87 | HR 75 | Ht 69.0 in | Wt 261.2 lb

## 2022-11-05 DIAGNOSIS — E669 Obesity, unspecified: Secondary | ICD-10-CM | POA: Diagnosis not present

## 2022-11-05 DIAGNOSIS — R030 Elevated blood-pressure reading, without diagnosis of hypertension: Secondary | ICD-10-CM

## 2022-11-05 DIAGNOSIS — Z823 Family history of stroke: Secondary | ICD-10-CM

## 2022-11-05 DIAGNOSIS — E7800 Pure hypercholesterolemia, unspecified: Secondary | ICD-10-CM

## 2022-11-05 DIAGNOSIS — Z8249 Family history of ischemic heart disease and other diseases of the circulatory system: Secondary | ICD-10-CM

## 2022-11-05 MED ORDER — WEGOVY 0.25 MG/0.5ML ~~LOC~~ SOAJ: SUBCUTANEOUS | 1 refills | Status: DC

## 2022-11-05 MED ORDER — WEGOVY 0.25 MG/0.5ML ~~LOC~~ SOAJ
0.2500 mg | SUBCUTANEOUS | 1 refills | Status: AC
  Filled 2022-11-05 (×2): qty 2, 28d supply, fill #0

## 2022-11-05 NOTE — Progress Notes (Unsigned)
Established patient visit  Patient: Christina Bautista   DOB: Dec 04, 1985   37 y.o. Female  MRN: [id number] Visit Date: 11/05/2022  Today's healthcare provider: Debera Lat, PA-C   Chief Complaint  Patient presents with   Follow-up    BP f/u  & Weight loss program concerns   Subjective     Discussed the use of AI scribe software for clinical note transcription with the patient, who gave verbal consent to proceed.  History of Present Illness   The patient, with a history of high blood pressure and obesity, presents for a follow-up visit. They report that their blood pressure has been consistently high at home, but they do not have specific numbers to provide. They speculate that their high caffeine intake and preference for salty foods may be contributing to their elevated blood pressure. They also note that their fingers are often "puffy and tight."  Regarding their weight, the patient is awaiting approval from their insurance for a weight loss program. They have not yet received any calls or notifications about this. They express a desire for guidance in managing their weight and are open to trying a weight loss program. They report that they have been going to the gym and walking for about an hour, which had previously helped with weight loss until they stopped. They are now trying to resume this routine.  The patient has a family history of stroke in both parents and diabetes. They deny any personal history of thyroid cancer or pancreatitis.           10/21/2022   10:57 AM  Depression screen PHQ 2/9  Decreased Interest 0  Down, Depressed, Hopeless 0  PHQ - 2 Score 0  Altered sleeping 0  Tired, decreased energy 0  Change in appetite 1  Feeling bad or failure about yourself  0  Trouble concentrating 1  Moving slowly or fidgety/restless 0  Suicidal thoughts 0  PHQ-9 Score 2      10/21/2022   10:57 AM  GAD 7 : Generalized Anxiety Score  Nervous, Anxious, on Edge 0   Control/stop worrying 1  Worry too much - different things 1  Trouble relaxing 1  Restless 0  Easily annoyed or irritable 1  Afraid - awful might happen 1  Total GAD 7 Score 5    Medications: No outpatient medications prior to visit.   No facility-administered medications prior to visit.    Review of Systems Except see HPI   {Insert previous labs (optional):23779} {See past labs  Heme  Chem  Endocrine  Serology  Results Review (optional):1}   Objective    BP 139/87 (BP Location: Right Arm, Patient Position: Sitting, Cuff Size: Large)   Pulse 75   Ht 5\' 9"  (1.753 m)   Wt 261 lb 3.2 oz (118.5 kg)   SpO2 99%   BMI 38.57 kg/m  {Insert last BP/Wt (optional):23777}{See vitals history (optional):1}   Physical Exam   No results found for any visits on 11/05/22.  Assessment & Plan    1. Elevated blood pressure reading in office without diagnosis of hypertension Hypertension Borderline blood pressure readings. Discussed the importance of consistent monitoring and lifestyle modifications including DASH and Mediterranean diets. -Advise patient to record blood pressure readings at home and send to the clinic for review. -If readings consistently >140/90, consider initiation of antihypertensive medication.  2. Elevated cholesterol Hyperlipidemia Elevated cholesterol levels. Family history of stroke in both parents increases cardiovascular risk. -Continue monitoring.  3. Obesity (BMI 30-39.9)  Obesity Patient is participating in a weight loss program. Discussed the benefits of diet and exercise. Pending insurance approval for weight loss medication (Vosevi or Zebant). Sports administrator for approval of weight loss medication. -Continue with diet and exercise regimen. -Refer to nutritionist program for further guidance. -Follow up in six weeks to assess progress and adjust plan as necessary.   4. Family history of stroke or transient ischemic attack in  mother ***  5. Family history of stroke or transient ischemic attack in father ***  6. Family history of cardiac disorder ***       Return in about 6 weeks (around 12/17/2022) for obesity/starting on wegovy.     The patient was advised to call back or seek an in-person evaluation if the symptoms worsen or if the condition fails to improve as anticipated.  I discussed the assessment and treatment plan with the patient. The patient was provided an opportunity to ask questions and all were answered. The patient agreed with the plan and demonstrated an understanding of the instructions.  I, Debera Lat, PA-C have reviewed all documentation for this visit. The documentation on 11/05/22  for the exam, diagnosis, procedures, and orders are all accurate and complete.  Debera Lat, [facility], MMS [facility] [phone] (phone) [phone] (fax)  [facility]

## 2022-11-05 NOTE — Telephone Encounter (Signed)
Medication Refill - Medication: Semaglutide-Weight Management (WEGOVY) 0.25 MG/0.5ML SOAJ  Has the patient contacted their pharmacy? Yes.    Medication not available at [facility], patient request it to be filled at the [facility]   Preferred Pharmacy (with phone number or street name):  [facility]  [address] [place] [address]  Phone: [phone] Fax: [phone]  Hours: M-F 7:30a-6p       Has the patient been seen for an appointment in the last year OR does the patient have an upcoming appointment? Yes.    Agent: Please be advised that RX refills may take up to 3 business days. We ask that you follow-up with your pharmacy.

## 2022-11-05 NOTE — Telephone Encounter (Signed)
Resending as provider ordered.  Requested Prescriptions  Pending Prescriptions Disp Refills   Semaglutide-Weight Management (WEGOVY) 0.25 MG/0.5ML SOAJ 2 mL 1    Sig: Initial dosage and titration, 0.25 mg subQ once weekly for weeks 1 through 4; then 0.5 mg once weekly for weeks 5 through 8     Endocrinology:  Diabetes - GLP-1 Receptor Agonists - semaglutide Failed - 11/05/2022 12:09 PM      Failed - Cr in normal range and within 360 days    Creatinine, Ser  Date Value Ref Range Status  10/21/2022 1.07 (H) 0.57 - 1.00 mg/dL Final         Passed - HBA1C in normal range and within 180 days    Hgb A1c MFr Bld  Date Value Ref Range Status  10/21/2022 4.8 4.8 - 5.6 % Final    Comment:             Prediabetes: 5.7 - 6.4          Diabetes: >6.4          Glycemic control for adults with diabetes: <7.0          Passed - Valid encounter within last 6 months    Recent Outpatient Visits           Today Elevated blood pressure reading in office without diagnosis of hypertension   [facility] Barlow, [place], PA-C   2 weeks ago Obesity (BMI 30-39.9)   [facility] Vent[place], [place], PA-C       Future Appointments             In 1 month Ostwalt, Edmon Crape, PA-C [facility] [place], [place]

## 2022-11-06 ENCOUNTER — Encounter: Payer: Self-pay | Admitting: Physician Assistant

## 2022-11-06 DIAGNOSIS — E7800 Pure hypercholesterolemia, unspecified: Secondary | ICD-10-CM | POA: Insufficient documentation

## 2022-12-17 ENCOUNTER — Ambulatory Visit: Payer: Commercial Managed Care - PPO | Admitting: Physician Assistant

## 2024-03-12 ENCOUNTER — Encounter: Admitting: Physician Assistant
